# Patient Record
Sex: Female | Born: 1990 | Race: Black or African American | Hispanic: No | Marital: Single
Health system: Southern US, Community
[De-identification: ages and names within clinical notes are randomized; demographics above are authoritative.]

## PROBLEM LIST (undated history)

## (undated) DIAGNOSIS — I10 Essential (primary) hypertension: Secondary | ICD-10-CM

---
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
## 2011-02-12 ENCOUNTER — Inpatient Hospital Stay (INDEPENDENT_AMBULATORY_CARE_PROVIDER_SITE_OTHER)
Admission: RE | Admit: 2011-02-12 | Discharge: 2011-02-12 | Disposition: A | Discharge status: Home / Self Care | Payer: 59 | Source: Ambulatory Visit | Attending: Family Medicine | Admitting: Family Medicine

## 2011-02-12 DIAGNOSIS — S61209A Unspecified open wound of unspecified finger without damage to nail, initial encounter: Secondary | ICD-10-CM

## 2014-12-22 ENCOUNTER — Ambulatory Visit
Admission: RE | Admit: 2014-12-22 | Discharge: 2014-12-22 | Disposition: A | Discharge status: Home / Self Care | Payer: 59 | Source: Ambulatory Visit | Attending: Family Medicine | Admitting: Family Medicine

## 2014-12-22 ENCOUNTER — Other Ambulatory Visit: Payer: Self-pay | Admitting: Family Medicine

## 2014-12-22 DIAGNOSIS — M79644 Pain in right finger(s): Secondary | ICD-10-CM

## 2015-12-15 DIAGNOSIS — F419 Anxiety disorder, unspecified: Secondary | ICD-10-CM | POA: Diagnosis not present

## 2015-12-21 MED FILL — CITALOPRAM HBR 40 MG TABLET: 40 | 90 days supply | Qty: 90 | Fill #0

## 2016-03-20 MED FILL — CITALOPRAM HBR 40 MG TABLET: 40 | 90 days supply | Qty: 90 | Fill #1

## 2016-05-17 DIAGNOSIS — F411 Generalized anxiety disorder: Secondary | ICD-10-CM | POA: Diagnosis not present

## 2016-06-20 MED FILL — CITALOPRAM HBR 40 MG TABLET: 40 | 90 days supply | Qty: 90 | Fill #0

## 2016-07-13 IMAGING — CR DG FINGER MIDDLE 2+V*R*
1 series · 1 of 1 positions shown · non-contrast
Comparison: None.

CLINICAL DATA: Pain right third digit. No known injury. Initial
evaluation.

EXAM:
RIGHT MIDDLE FINGER 2+V

[view not recorded]
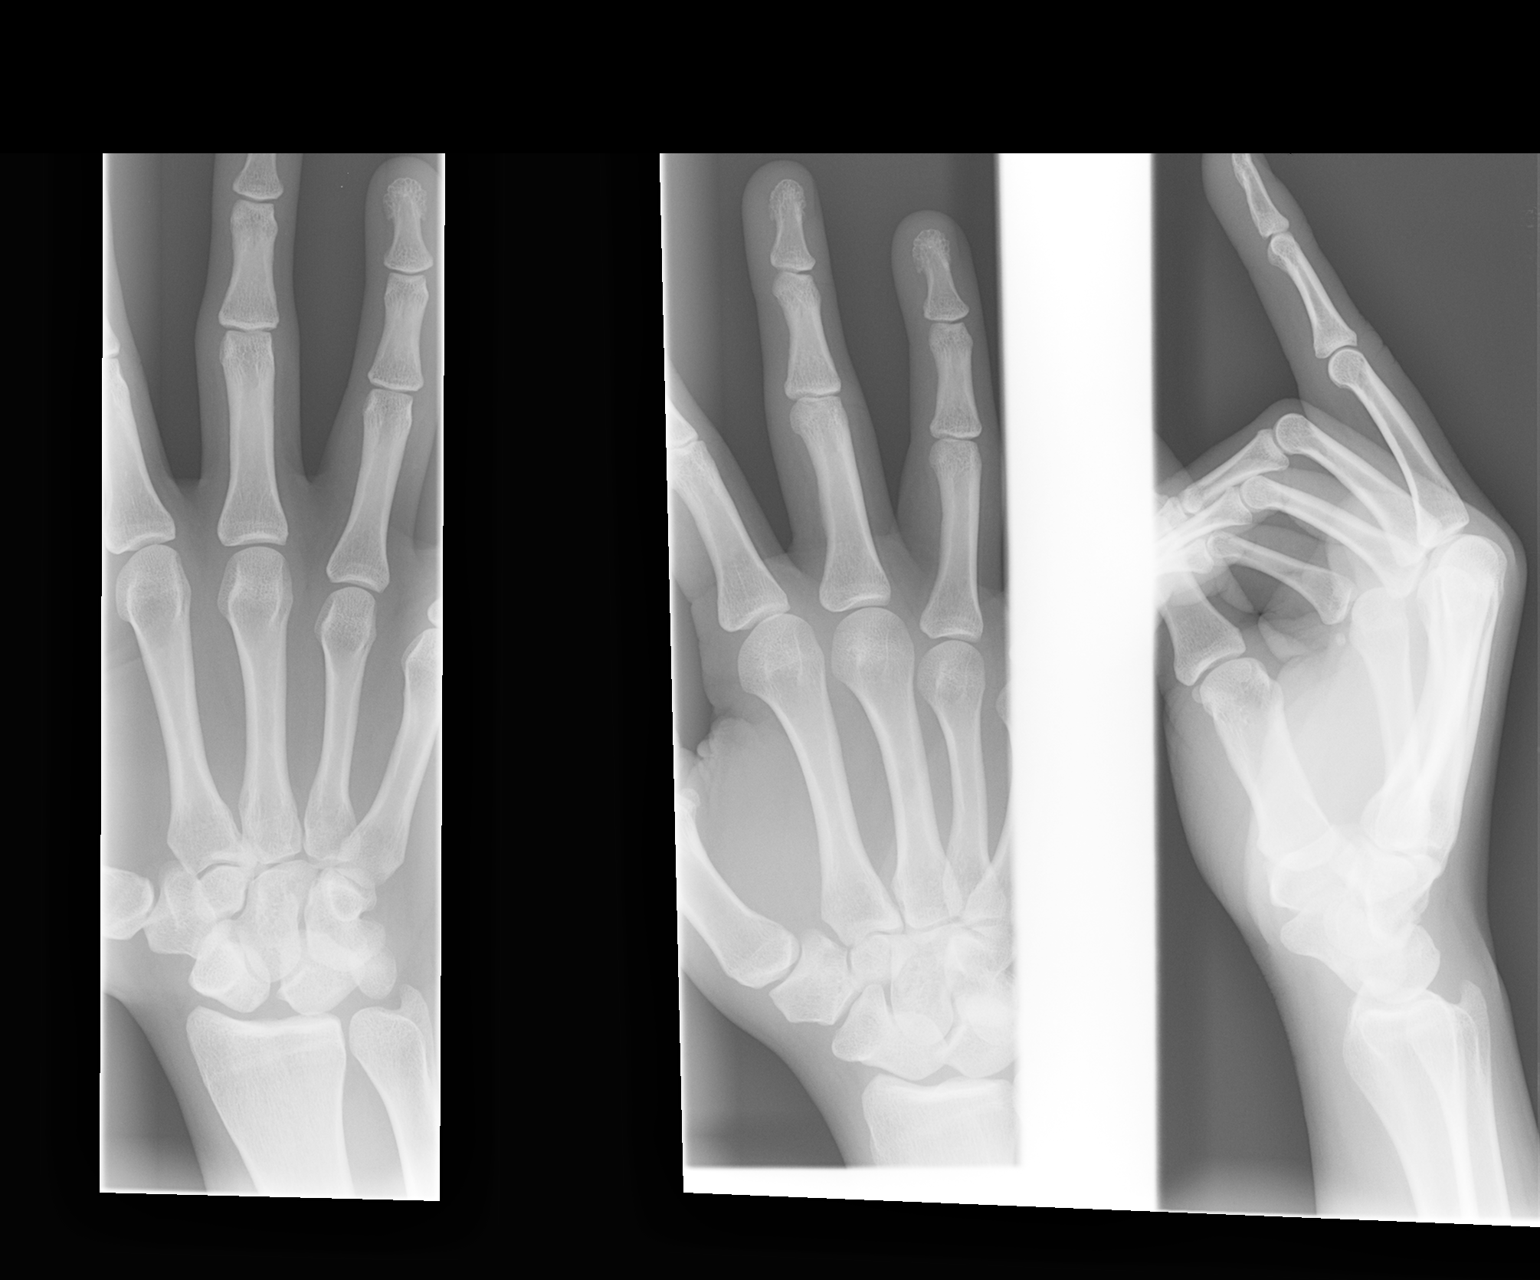

[1 of 1 positions shown; findings below may reference images not displayed]

FINDINGS: There is no evidence of fracture or dislocation. There is no
evidence of arthropathy or other focal bone abnormality. Soft
tissues are unremarkable.
IMPRESSION: No acute bony or joint abnormality identified. No evidence fracture
or dislocation.

## 2016-09-26 MED FILL — CITALOPRAM HBR 40 MG TABLET: 40 | 90 days supply | Qty: 90 | Fill #1

## 2016-11-03 DIAGNOSIS — H52223 Regular astigmatism, bilateral: Secondary | ICD-10-CM | POA: Diagnosis not present

## 2016-11-03 DIAGNOSIS — H5203 Hypermetropia, bilateral: Secondary | ICD-10-CM | POA: Diagnosis not present

## 2016-11-14 DIAGNOSIS — F411 Generalized anxiety disorder: Secondary | ICD-10-CM | POA: Diagnosis not present

## 2016-11-14 DIAGNOSIS — F324 Major depressive disorder, single episode, in partial remission: Secondary | ICD-10-CM | POA: Diagnosis not present

## 2016-11-14 DIAGNOSIS — E6609 Other obesity due to excess calories: Secondary | ICD-10-CM | POA: Diagnosis not present

## 2016-11-14 DIAGNOSIS — Z6835 Body mass index (BMI) 35.0-35.9, adult: Secondary | ICD-10-CM | POA: Diagnosis not present

## 2017-01-11 MED FILL — CITALOPRAM HBR 40 MG TABLET: 40 | 90 days supply | Qty: 90 | Fill #0

## 2017-04-20 MED FILL — CITALOPRAM HBR 40 MG TABLET: 40 | 90 days supply | Qty: 90 | Fill #1

## 2017-05-17 DIAGNOSIS — F324 Major depressive disorder, single episode, in partial remission: Secondary | ICD-10-CM | POA: Diagnosis not present

## 2017-05-17 DIAGNOSIS — R05 Cough: Secondary | ICD-10-CM | POA: Diagnosis not present

## 2017-05-17 DIAGNOSIS — F1010 Alcohol abuse, uncomplicated: Secondary | ICD-10-CM | POA: Diagnosis not present

## 2017-05-17 DIAGNOSIS — F411 Generalized anxiety disorder: Secondary | ICD-10-CM | POA: Diagnosis not present

## 2017-05-17 DIAGNOSIS — E6609 Other obesity due to excess calories: Secondary | ICD-10-CM | POA: Diagnosis not present

## 2017-05-17 DIAGNOSIS — R739 Hyperglycemia, unspecified: Secondary | ICD-10-CM | POA: Diagnosis not present

## 2017-05-17 DIAGNOSIS — Z6836 Body mass index (BMI) 36.0-36.9, adult: Secondary | ICD-10-CM | POA: Diagnosis not present

## 2017-05-17 MED FILL — raNITIdine HCL 150 MG TABS: 150 | 90 days supply | Qty: 90 | Fill #0

## 2017-07-26 MED FILL — CITALOPRAM HBR 40 MG TABLET: 40 | 90 days supply | Qty: 90 | Fill #0

## 2017-10-26 MED FILL — CITALOPRAM HBR 40 MG TABLET: 40 | 90 days supply | Qty: 90 | Fill #1

## 2017-11-07 DIAGNOSIS — E559 Vitamin D deficiency, unspecified: Secondary | ICD-10-CM | POA: Diagnosis not present

## 2017-11-07 DIAGNOSIS — Z13220 Encounter for screening for lipoid disorders: Secondary | ICD-10-CM | POA: Diagnosis not present

## 2017-11-07 DIAGNOSIS — F324 Major depressive disorder, single episode, in partial remission: Secondary | ICD-10-CM | POA: Diagnosis not present

## 2017-11-07 DIAGNOSIS — Z0000 Encounter for general adult medical examination without abnormal findings: Secondary | ICD-10-CM | POA: Diagnosis not present

## 2017-11-07 DIAGNOSIS — Z23 Encounter for immunization: Secondary | ICD-10-CM | POA: Diagnosis not present

## 2017-11-07 DIAGNOSIS — E669 Obesity, unspecified: Secondary | ICD-10-CM | POA: Diagnosis not present

## 2017-11-07 DIAGNOSIS — Z136 Encounter for screening for cardiovascular disorders: Secondary | ICD-10-CM | POA: Diagnosis not present

## 2017-11-07 DIAGNOSIS — F1010 Alcohol abuse, uncomplicated: Secondary | ICD-10-CM | POA: Diagnosis not present

## 2017-11-07 DIAGNOSIS — Z124 Encounter for screening for malignant neoplasm of cervix: Secondary | ICD-10-CM | POA: Diagnosis not present

## 2017-11-07 DIAGNOSIS — F411 Generalized anxiety disorder: Secondary | ICD-10-CM | POA: Diagnosis not present

## 2017-11-07 DIAGNOSIS — K219 Gastro-esophageal reflux disease without esophagitis: Secondary | ICD-10-CM | POA: Diagnosis not present

## 2017-11-07 DIAGNOSIS — R739 Hyperglycemia, unspecified: Secondary | ICD-10-CM | POA: Diagnosis not present

## 2017-11-20 MED FILL — VITAMIN D3 50000 UNIT CAPS: 1.25 MG | 84 days supply | Qty: 12 | Fill #0

## 2017-11-21 MED FILL — raNITIdine HCL 150 MG TABS: 150 | 90 days supply | Qty: 180 | Fill #0

## 2017-11-21 MED FILL — VALSARTAN 160 MG TABS: 160 | 30 days supply | Qty: 30 | Fill #0

## 2017-12-17 MED FILL — VALSARTAN 160 MG TABLET: 160 | 30 days supply | Qty: 30 | Fill #1

## 2017-12-26 DIAGNOSIS — I10 Essential (primary) hypertension: Secondary | ICD-10-CM | POA: Diagnosis not present

## 2017-12-26 DIAGNOSIS — E559 Vitamin D deficiency, unspecified: Secondary | ICD-10-CM | POA: Diagnosis not present

## 2018-01-18 MED FILL — VALSARTAN 160 MG TABLET: 160 | 30 days supply | Qty: 30 | Fill #2

## 2018-01-18 MED FILL — CITALOPRAM HBR 40 MG TABLET: 40 | 90 days supply | Qty: 90 | Fill #0

## 2018-01-28 DIAGNOSIS — H52223 Regular astigmatism, bilateral: Secondary | ICD-10-CM | POA: Diagnosis not present

## 2018-01-28 DIAGNOSIS — H5203 Hypermetropia, bilateral: Secondary | ICD-10-CM | POA: Diagnosis not present

## 2018-02-19 MED FILL — VALSARTAN 160 MG TABLET: 160 | 30 days supply | Qty: 30 | Fill #3

## 2018-03-11 MED FILL — raNITIdine HCL 150 MG TABS: 150 | 90 days supply | Qty: 180 | Fill #1

## 2018-03-22 MED FILL — VALSARTAN 160 MG TABLET: 160 | 30 days supply | Qty: 30 | Fill #4

## 2018-04-19 MED FILL — CITALOPRAM HBR 40 MG TABLET: 40 | 90 days supply | Qty: 90 | Fill #1

## 2018-04-19 MED FILL — VALSARTAN 160 MG TABLET: 160 | 30 days supply | Qty: 30 | Fill #5

## 2018-05-14 DIAGNOSIS — F324 Major depressive disorder, single episode, in partial remission: Secondary | ICD-10-CM | POA: Diagnosis not present

## 2018-05-14 DIAGNOSIS — E669 Obesity, unspecified: Secondary | ICD-10-CM | POA: Diagnosis not present

## 2018-05-14 DIAGNOSIS — R7303 Prediabetes: Secondary | ICD-10-CM | POA: Diagnosis not present

## 2018-05-14 DIAGNOSIS — I10 Essential (primary) hypertension: Secondary | ICD-10-CM | POA: Diagnosis not present

## 2018-05-14 DIAGNOSIS — E559 Vitamin D deficiency, unspecified: Secondary | ICD-10-CM | POA: Diagnosis not present

## 2018-05-14 DIAGNOSIS — F411 Generalized anxiety disorder: Secondary | ICD-10-CM | POA: Diagnosis not present

## 2018-05-14 DIAGNOSIS — F1010 Alcohol abuse, uncomplicated: Secondary | ICD-10-CM | POA: Diagnosis not present

## 2018-05-14 DIAGNOSIS — K219 Gastro-esophageal reflux disease without esophagitis: Secondary | ICD-10-CM | POA: Diagnosis not present

## 2018-05-24 MED FILL — FAMOTIDINE 40 MG TABS: 40 | 90 days supply | Qty: 180 | Fill #0

## 2018-05-24 MED FILL — VALSARTAN 160 MG TABLET: 160 | 90 days supply | Qty: 90 | Fill #0

## 2018-07-25 MED FILL — CITALOPRAM HBR 40 MG TABLET: 40 | 90 days supply | Qty: 90 | Fill #0

## 2018-08-08 MED FILL — FAMOTIDINE 40 MG TABLET: 40 | 30 days supply | Qty: 60 | Fill #1

## 2018-08-08 MED FILL — VALSARTAN 160 MG TABLET: 160 | 30 days supply | Qty: 30 | Fill #1

## 2018-09-09 MED FILL — VALSARTAN 80 MG TABLET: 80 | 30 days supply | Qty: 60 | Fill #0

## 2018-09-10 MED FILL — CIMETIDINE 800 MG TABLET: 800 | 90 days supply | Qty: 180 | Fill #0

## 2018-09-10 MED FILL — FAMOTIDINE 40 MG TABLET: 40 | 30 days supply | Qty: 60 | Fill #2

## 2018-10-18 MED FILL — OLMESARTAN MEDOXOMIL 20 MG: 20 | 30 days supply | Qty: 30 | Fill #0

## 2018-10-24 MED FILL — CITALOPRAM HBR 40 MG TABLET: 40 | 90 days supply | Qty: 90 | Fill #1

## 2018-11-12 DIAGNOSIS — K219 Gastro-esophageal reflux disease without esophagitis: Secondary | ICD-10-CM | POA: Diagnosis not present

## 2018-11-12 DIAGNOSIS — F324 Major depressive disorder, single episode, in partial remission: Secondary | ICD-10-CM | POA: Diagnosis not present

## 2018-11-12 DIAGNOSIS — F1010 Alcohol abuse, uncomplicated: Secondary | ICD-10-CM | POA: Diagnosis not present

## 2018-11-12 DIAGNOSIS — E669 Obesity, unspecified: Secondary | ICD-10-CM | POA: Diagnosis not present

## 2018-11-12 DIAGNOSIS — R7303 Prediabetes: Secondary | ICD-10-CM | POA: Diagnosis not present

## 2018-11-12 DIAGNOSIS — E559 Vitamin D deficiency, unspecified: Secondary | ICD-10-CM | POA: Diagnosis not present

## 2018-11-12 DIAGNOSIS — I10 Essential (primary) hypertension: Secondary | ICD-10-CM | POA: Diagnosis not present

## 2018-11-12 DIAGNOSIS — F411 Generalized anxiety disorder: Secondary | ICD-10-CM | POA: Diagnosis not present

## 2018-11-12 DIAGNOSIS — N939 Abnormal uterine and vaginal bleeding, unspecified: Secondary | ICD-10-CM | POA: Diagnosis not present

## 2018-11-12 MED FILL — OLMESARTAN MEDOXOMIL 20 MG: 20 | 30 days supply | Qty: 30 | Fill #0

## 2018-11-12 MED FILL — SM ACID REDUCER 20 MG TAB: 20 | 25 days supply | Qty: 100 | Fill #0

## 2018-12-11 DIAGNOSIS — R7303 Prediabetes: Secondary | ICD-10-CM | POA: Diagnosis not present

## 2018-12-11 DIAGNOSIS — E669 Obesity, unspecified: Secondary | ICD-10-CM | POA: Diagnosis not present

## 2018-12-11 DIAGNOSIS — I10 Essential (primary) hypertension: Secondary | ICD-10-CM | POA: Diagnosis not present

## 2018-12-11 DIAGNOSIS — N939 Abnormal uterine and vaginal bleeding, unspecified: Secondary | ICD-10-CM | POA: Diagnosis not present

## 2018-12-11 DIAGNOSIS — E559 Vitamin D deficiency, unspecified: Secondary | ICD-10-CM | POA: Diagnosis not present

## 2018-12-18 DIAGNOSIS — N939 Abnormal uterine and vaginal bleeding, unspecified: Secondary | ICD-10-CM | POA: Diagnosis not present

## 2018-12-18 DIAGNOSIS — Z01411 Encounter for gynecological examination (general) (routine) with abnormal findings: Secondary | ICD-10-CM | POA: Diagnosis not present

## 2018-12-18 DIAGNOSIS — Z113 Encounter for screening for infections with a predominantly sexual mode of transmission: Secondary | ICD-10-CM | POA: Diagnosis not present

## 2018-12-18 MED FILL — NORETHIND-ETH ESTRAD 1-0.02: 1-20 | 84 days supply | Qty: 63 | Fill #0

## 2018-12-19 MED FILL — OLMESARTAN MEDOXOMIL 20 MG: 20 | 30 days supply | Qty: 30 | Fill #1

## 2019-01-03 MED FILL — FAMOTIDINE 20 MG TABS: 20 | 30 days supply | Qty: 120 | Fill #1

## 2019-01-15 MED FILL — CHLORHEXIDINE 0.12% RINSE: 0.12 | 16 days supply | Qty: 473 | Fill #0

## 2019-01-17 MED FILL — CITALOPRAM HBR 40 MG TABLET: 40 | 90 days supply | Qty: 90 | Fill #0

## 2019-01-17 MED FILL — OLMESARTAN MEDOXOMIL 20 MG: 20 | 30 days supply | Qty: 30 | Fill #2

## 2019-02-03 MED FILL — FAMOTIDINE 20 MG TABS: 20 | 30 days supply | Qty: 120 | Fill #2

## 2019-02-18 MED FILL — FAMOTIDINE 20 MG TABS: 20 | 30 days supply | Qty: 120 | Fill #2

## 2019-02-18 MED FILL — OLMESARTAN MEDOXOMIL 20 MG: 20 | 30 days supply | Qty: 30 | Fill #3

## 2019-02-24 MED FILL — NORETHIND-ETH ESTRAD 1-0.02: 1-20 | 84 days supply | Qty: 63 | Fill #1

## 2019-03-19 MED FILL — OLMESARTAN MEDOXOMIL 20 MG: 20 | 30 days supply | Qty: 30 | Fill #4

## 2019-03-21 DIAGNOSIS — N939 Abnormal uterine and vaginal bleeding, unspecified: Secondary | ICD-10-CM | POA: Diagnosis not present

## 2019-03-28 MED FILL — FAMOTIDINE 20 MG TABS: 20 | 30 days supply | Qty: 120 | Fill #3

## 2019-04-18 MED FILL — OLMESARTAN MEDOXOMIL 20 MG: 20 | 30 days supply | Qty: 30 | Fill #5

## 2019-04-18 MED FILL — CITALOPRAM HBR 40 MG TABLET: 40 | 90 days supply | Qty: 90 | Fill #1

## 2019-04-25 MED FILL — NORETHIND-ETH ESTRAD 1-0.02: 1-20 | 84 days supply | Qty: 84 | Fill #0

## 2019-04-30 MED FILL — FAMOTIDINE 40 MG TABLET: 40 | 30 days supply | Qty: 60 | Fill #0

## 2019-05-20 MED FILL — OLMESARTAN MEDOXOMIL 20 MG: 20 | 30 days supply | Qty: 30 | Fill #1

## 2019-06-02 MED FILL — FAMOTIDINE 40 MG TABS: 40 | 30 days supply | Qty: 60 | Fill #1

## 2019-06-16 MED FILL — OLMESARTAN MEDOXOMIL 20 MG: 20 | 30 days supply | Qty: 30 | Fill #2

## 2019-07-02 MED FILL — FAMOTIDINE 40 MG TABS: 40 | 30 days supply | Qty: 60 | Fill #2

## 2019-07-16 MED FILL — OLMESARTAN MEDOXOMIL 20 MG: 20 | 30 days supply | Qty: 30 | Fill #3

## 2019-07-16 MED FILL — CITALOPRAM HBR 40 MG TABLET: 40 | 90 days supply | Qty: 90 | Fill #0

## 2019-07-16 MED FILL — NORETHIND-ETH ESTRAD 1-0.02: 1-20 | 84 days supply | Qty: 84 | Fill #1

## 2019-08-04 MED FILL — FAMOTIDINE 40 MG TABS: 40 | 30 days supply | Qty: 60 | Fill #3

## 2019-08-13 DIAGNOSIS — H5201 Hypermetropia, right eye: Secondary | ICD-10-CM | POA: Diagnosis not present

## 2019-08-13 DIAGNOSIS — H52223 Regular astigmatism, bilateral: Secondary | ICD-10-CM | POA: Diagnosis not present

## 2019-08-19 MED FILL — OLMESARTAN MEDOXOMIL 20 MG: 20 | 30 days supply | Qty: 30 | Fill #4

## 2019-09-10 MED FILL — FAMOTIDINE 40 MG TABS: 40 | 90 days supply | Qty: 180 | Fill #0

## 2019-10-07 MED FILL — NORETHIND-ETH ESTRAD 1-0.02: 1-20 | 84 days supply | Qty: 84 | Fill #2

## 2019-10-22 MED FILL — CITALOPRAM HBR 40 MG TABLET: 40 | 90 days supply | Qty: 90 | Fill #0

## 2019-11-07 ENCOUNTER — Other Ambulatory Visit (HOSPITAL_COMMUNITY): Payer: Self-pay | Admitting: Family Medicine

## 2019-11-07 DIAGNOSIS — F411 Generalized anxiety disorder: Secondary | ICD-10-CM | POA: Diagnosis not present

## 2019-11-07 DIAGNOSIS — K219 Gastro-esophageal reflux disease without esophagitis: Secondary | ICD-10-CM | POA: Diagnosis not present

## 2019-11-07 DIAGNOSIS — Z13220 Encounter for screening for lipoid disorders: Secondary | ICD-10-CM | POA: Diagnosis not present

## 2019-11-07 DIAGNOSIS — I10 Essential (primary) hypertension: Secondary | ICD-10-CM | POA: Diagnosis not present

## 2019-11-07 DIAGNOSIS — R7303 Prediabetes: Secondary | ICD-10-CM | POA: Diagnosis not present

## 2019-11-07 DIAGNOSIS — E559 Vitamin D deficiency, unspecified: Secondary | ICD-10-CM | POA: Diagnosis not present

## 2019-11-07 DIAGNOSIS — Z0000 Encounter for general adult medical examination without abnormal findings: Secondary | ICD-10-CM | POA: Diagnosis not present

## 2019-11-07 DIAGNOSIS — F1010 Alcohol abuse, uncomplicated: Secondary | ICD-10-CM | POA: Diagnosis not present

## 2019-11-07 DIAGNOSIS — F324 Major depressive disorder, single episode, in partial remission: Secondary | ICD-10-CM | POA: Diagnosis not present

## 2019-12-19 ENCOUNTER — Other Ambulatory Visit (HOSPITAL_COMMUNITY): Payer: Self-pay | Admitting: Obstetrics & Gynecology

## 2019-12-19 DIAGNOSIS — N939 Abnormal uterine and vaginal bleeding, unspecified: Secondary | ICD-10-CM | POA: Diagnosis not present

## 2019-12-19 DIAGNOSIS — Z01419 Encounter for gynecological examination (general) (routine) without abnormal findings: Secondary | ICD-10-CM | POA: Diagnosis not present

## 2019-12-19 DIAGNOSIS — Z3041 Encounter for surveillance of contraceptive pills: Secondary | ICD-10-CM | POA: Diagnosis not present

## 2019-12-31 MED FILL — NORETHIND-ETH ESTRAD 1-0.02: 1-20 | 84 days supply | Qty: 84 | Fill #3

## 2020-01-02 ENCOUNTER — Other Ambulatory Visit (HOSPITAL_COMMUNITY): Payer: Self-pay | Admitting: Family Medicine

## 2020-01-02 DIAGNOSIS — F324 Major depressive disorder, single episode, in partial remission: Secondary | ICD-10-CM | POA: Diagnosis not present

## 2020-01-02 DIAGNOSIS — F411 Generalized anxiety disorder: Secondary | ICD-10-CM | POA: Diagnosis not present

## 2020-01-02 MED FILL — ESCITALOPRAM 10 MG TABLET: 10 | 30 days supply | Qty: 60 | Fill #0

## 2020-01-02 MED FILL — ALPRAZolam 0.25 MG TABS: 0.25 | 7 days supply | Qty: 15 | Fill #0

## 2020-01-06 MED FILL — FAMOTIDINE 40 MG TABS: 40 | 90 days supply | Qty: 180 | Fill #0

## 2020-01-14 MED FILL — OLMESARTAN MEDOXOMIL 20 MG: 20 | 90 days supply | Qty: 90 | Fill #0

## 2020-01-16 ENCOUNTER — Other Ambulatory Visit (HOSPITAL_COMMUNITY): Payer: Self-pay | Admitting: Family Medicine

## 2020-01-21 DIAGNOSIS — R05 Cough: Secondary | ICD-10-CM | POA: Diagnosis not present

## 2020-01-21 MED FILL — BENZONATATE 100 MG CAPS: 100 | 10 days supply | Qty: 60 | Fill #0

## 2020-03-08 MED FILL — ESCITALOPRAM 10 MG TABLET: 10 | 30 days supply | Qty: 60 | Fill #1

## 2020-03-24 MED FILL — NORETHIND-ETH ESTRAD 1-0.02: 1-20 | 84 days supply | Qty: 84 | Fill #4

## 2020-04-13 MED FILL — OLMESARTAN MEDOXOMIL 20 MG: 20 | 90 days supply | Qty: 90 | Fill #1

## 2020-04-20 MED FILL — FAMOTIDINE 40 MG TABS: 40 | 90 days supply | Qty: 180 | Fill #1

## 2020-05-04 MED FILL — ESCITALOPRAM 10 MG TABLET: 10 | 30 days supply | Qty: 60 | Fill #2

## 2020-05-19 ENCOUNTER — Other Ambulatory Visit (HOSPITAL_COMMUNITY): Payer: Self-pay | Admitting: Family Medicine

## 2020-05-19 DIAGNOSIS — F324 Major depressive disorder, single episode, in partial remission: Secondary | ICD-10-CM | POA: Diagnosis not present

## 2020-05-19 DIAGNOSIS — R7303 Prediabetes: Secondary | ICD-10-CM | POA: Diagnosis not present

## 2020-05-19 DIAGNOSIS — I10 Essential (primary) hypertension: Secondary | ICD-10-CM | POA: Diagnosis not present

## 2020-05-19 DIAGNOSIS — E559 Vitamin D deficiency, unspecified: Secondary | ICD-10-CM | POA: Diagnosis not present

## 2020-05-19 DIAGNOSIS — F419 Anxiety disorder, unspecified: Secondary | ICD-10-CM | POA: Diagnosis not present

## 2020-05-19 DIAGNOSIS — K219 Gastro-esophageal reflux disease without esophagitis: Secondary | ICD-10-CM | POA: Diagnosis not present

## 2020-05-19 MED FILL — ESCITALOPRAM 20 MG TABLET: 20 | 30 days supply | Qty: 30 | Fill #0

## 2020-05-19 MED FILL — ALPRAZolam 0.25 MG TABS: 0.25 | 30 days supply | Qty: 15 | Fill #0

## 2020-06-14 MED FILL — ESCITALOPRAM 20 MG TABLET: 20 | 30 days supply | Qty: 30 | Fill #1

## 2020-06-16 ENCOUNTER — Other Ambulatory Visit (HOSPITAL_COMMUNITY): Payer: Self-pay | Admitting: Family Medicine

## 2020-06-16 DIAGNOSIS — F324 Major depressive disorder, single episode, in partial remission: Secondary | ICD-10-CM | POA: Diagnosis not present

## 2020-06-16 DIAGNOSIS — F411 Generalized anxiety disorder: Secondary | ICD-10-CM | POA: Diagnosis not present

## 2020-06-16 DIAGNOSIS — B354 Tinea corporis: Secondary | ICD-10-CM | POA: Diagnosis not present

## 2020-06-16 MED FILL — NORETHIND-ETH ESTRAD 1-0.02: 1-20 | 84 days supply | Qty: 84 | Fill #0

## 2020-06-16 MED FILL — FLUCONAZOLE 150 MG TABS: 150 | 28 days supply | Qty: 4 | Fill #0

## 2020-06-22 DIAGNOSIS — R7303 Prediabetes: Secondary | ICD-10-CM | POA: Diagnosis not present

## 2020-06-22 DIAGNOSIS — Z13220 Encounter for screening for lipoid disorders: Secondary | ICD-10-CM | POA: Diagnosis not present

## 2020-06-22 DIAGNOSIS — I10 Essential (primary) hypertension: Secondary | ICD-10-CM | POA: Diagnosis not present

## 2020-06-25 MED FILL — OLMESARTAN MEDOXOMIL 20 MG: 20 | 90 days supply | Qty: 90 | Fill #0

## 2020-07-23 ENCOUNTER — Other Ambulatory Visit (HOSPITAL_COMMUNITY): Payer: Self-pay | Admitting: Family Medicine

## 2020-07-30 ENCOUNTER — Other Ambulatory Visit (HOSPITAL_BASED_OUTPATIENT_CLINIC_OR_DEPARTMENT_OTHER): Payer: Self-pay

## 2020-08-17 ENCOUNTER — Other Ambulatory Visit (HOSPITAL_COMMUNITY): Payer: Self-pay

## 2020-08-17 MED ORDER — ESCITALOPRAM OXALATE 20 MG PO TABS
ORAL_TABLET | ORAL | 0 refills | Status: DC
  Filled 2020-08-17: qty 90, 90d supply, fill #0

## 2020-08-19 ENCOUNTER — Other Ambulatory Visit (HOSPITAL_COMMUNITY): Payer: Self-pay

## 2020-09-07 ENCOUNTER — Other Ambulatory Visit (HOSPITAL_COMMUNITY): Payer: Self-pay

## 2020-09-07 MED FILL — Norethindrone Ace & Ethinyl Estradiol Tab 1 MG-20 MCG: ORAL | 84 days supply | Qty: 84 | Fill #0 | Status: AC

## 2020-09-14 DIAGNOSIS — H5203 Hypermetropia, bilateral: Secondary | ICD-10-CM | POA: Diagnosis not present

## 2020-09-14 DIAGNOSIS — H52223 Regular astigmatism, bilateral: Secondary | ICD-10-CM | POA: Diagnosis not present

## 2020-10-12 ENCOUNTER — Other Ambulatory Visit (HOSPITAL_COMMUNITY): Payer: Self-pay

## 2020-10-12 MED FILL — Olmesartan Medoxomil Tab 20 MG: ORAL | 90 days supply | Qty: 90 | Fill #0 | Status: AC

## 2020-10-22 DIAGNOSIS — G43909 Migraine, unspecified, not intractable, without status migrainosus: Secondary | ICD-10-CM | POA: Diagnosis not present

## 2020-10-25 ENCOUNTER — Other Ambulatory Visit (HOSPITAL_COMMUNITY): Payer: Self-pay

## 2020-10-25 MED FILL — Famotidine Tab 40 MG: ORAL | 90 days supply | Qty: 180 | Fill #0 | Status: AC

## 2020-11-15 ENCOUNTER — Other Ambulatory Visit (HOSPITAL_COMMUNITY): Payer: Self-pay

## 2020-11-15 MED ORDER — ESCITALOPRAM OXALATE 20 MG PO TABS
20.0000 mg | ORAL_TABLET | Freq: Every day | ORAL | 0 refills | Status: DC
  Filled 2020-11-15: qty 90, 90d supply, fill #0

## 2020-11-16 ENCOUNTER — Other Ambulatory Visit (HOSPITAL_COMMUNITY): Payer: Self-pay

## 2020-11-29 ENCOUNTER — Other Ambulatory Visit (HOSPITAL_COMMUNITY): Payer: Self-pay

## 2020-11-29 MED FILL — Norethindrone Ace & Ethinyl Estradiol Tab 1 MG-20 MCG: ORAL | 84 days supply | Qty: 84 | Fill #1 | Status: AC

## 2020-11-30 ENCOUNTER — Ambulatory Visit: Payer: 59

## 2020-12-14 ENCOUNTER — Other Ambulatory Visit (HOSPITAL_COMMUNITY): Payer: Self-pay

## 2020-12-14 MED ORDER — ALPRAZOLAM 0.25 MG PO TABS
ORAL_TABLET | ORAL | 0 refills | Status: DC
  Filled 2020-12-14: qty 30, 30d supply, fill #0

## 2020-12-24 ENCOUNTER — Other Ambulatory Visit (HOSPITAL_COMMUNITY): Payer: Self-pay

## 2020-12-24 DIAGNOSIS — M545 Low back pain, unspecified: Secondary | ICD-10-CM | POA: Diagnosis not present

## 2020-12-24 MED ORDER — CARESTART COVID-19 HOME TEST VI KIT
PACK | 0 refills | Status: AC
  Filled 2020-12-24: qty 4, 4d supply, fill #0

## 2020-12-24 MED ORDER — TRAMADOL HCL 50 MG PO TABS
50.0000 mg | ORAL_TABLET | Freq: Two times a day (BID) | ORAL | 0 refills | Status: AC | PRN
  Filled 2020-12-24: qty 6, 3d supply, fill #0

## 2020-12-24 MED ORDER — CYCLOBENZAPRINE HCL 10 MG PO TABS
10.0000 mg | ORAL_TABLET | Freq: Three times a day (TID) | ORAL | 0 refills | Status: AC | PRN
  Filled 2020-12-24: qty 30, 10d supply, fill #0

## 2020-12-24 MED ORDER — MELOXICAM 15 MG PO TABS
15.0000 mg | ORAL_TABLET | Freq: Every day | ORAL | 0 refills | Status: DC
  Filled 2020-12-24: qty 14, 14d supply, fill #0

## 2021-01-11 ENCOUNTER — Other Ambulatory Visit (HOSPITAL_COMMUNITY): Payer: Self-pay

## 2021-01-11 MED ORDER — OLMESARTAN MEDOXOMIL 20 MG PO TABS
ORAL_TABLET | ORAL | 1 refills | Status: DC
  Filled 2021-01-11: qty 90, 90d supply, fill #0
  Filled 2021-04-11: qty 90, 90d supply, fill #1

## 2021-01-14 ENCOUNTER — Other Ambulatory Visit: Payer: Self-pay

## 2021-01-14 ENCOUNTER — Other Ambulatory Visit (HOSPITAL_BASED_OUTPATIENT_CLINIC_OR_DEPARTMENT_OTHER): Payer: Self-pay | Admitting: Family Medicine

## 2021-01-14 ENCOUNTER — Ambulatory Visit (HOSPITAL_BASED_OUTPATIENT_CLINIC_OR_DEPARTMENT_OTHER)
Admission: RE | Admit: 2021-01-14 | Discharge: 2021-01-14 | Disposition: A | Discharge status: Home / Self Care | Payer: 59 | Source: Ambulatory Visit | Attending: Family Medicine | Admitting: Family Medicine

## 2021-01-14 ENCOUNTER — Other Ambulatory Visit (HOSPITAL_COMMUNITY): Payer: Self-pay

## 2021-01-14 DIAGNOSIS — M79605 Pain in left leg: Secondary | ICD-10-CM | POA: Insufficient documentation

## 2021-01-14 MED ORDER — MELOXICAM 15 MG PO TABS
ORAL_TABLET | ORAL | 0 refills | Status: AC
  Filled 2021-01-14: qty 14, 14d supply, fill #0

## 2021-01-14 MED ORDER — MELOXICAM 15 MG PO TABS
15.0000 mg | ORAL_TABLET | Freq: Every day | ORAL | 0 refills | Status: DC
  Filled 2021-01-14: qty 14, 14d supply, fill #0

## 2021-01-24 ENCOUNTER — Other Ambulatory Visit (HOSPITAL_COMMUNITY): Payer: Self-pay

## 2021-01-24 DIAGNOSIS — Z01419 Encounter for gynecological examination (general) (routine) without abnormal findings: Secondary | ICD-10-CM | POA: Diagnosis not present

## 2021-01-24 DIAGNOSIS — Z803 Family history of malignant neoplasm of breast: Secondary | ICD-10-CM | POA: Diagnosis not present

## 2021-01-24 DIAGNOSIS — N939 Abnormal uterine and vaginal bleeding, unspecified: Secondary | ICD-10-CM | POA: Diagnosis not present

## 2021-01-24 MED ORDER — NORETHINDRONE ACET-ETHINYL EST 1-20 MG-MCG PO TABS
ORAL_TABLET | ORAL | 4 refills | Status: AC
  Filled 2021-01-24: qty 84, 84d supply, fill #0
  Filled 2021-05-16: qty 84, 84d supply, fill #1
  Filled 2021-08-07: qty 84, 84d supply, fill #2
  Filled 2021-10-26: qty 84, 84d supply, fill #3

## 2021-01-26 ENCOUNTER — Other Ambulatory Visit (HOSPITAL_COMMUNITY): Payer: Self-pay

## 2021-01-26 MED ORDER — FAMOTIDINE 40 MG PO TABS
ORAL_TABLET | ORAL | 1 refills | Status: DC
  Filled 2021-01-26: qty 180, 90d supply, fill #0
  Filled 2021-05-02: qty 180, 90d supply, fill #1

## 2021-01-27 ENCOUNTER — Other Ambulatory Visit (HOSPITAL_COMMUNITY): Payer: Self-pay

## 2021-02-07 ENCOUNTER — Other Ambulatory Visit (HOSPITAL_COMMUNITY): Payer: Self-pay

## 2021-02-14 ENCOUNTER — Other Ambulatory Visit (HOSPITAL_COMMUNITY): Payer: Self-pay

## 2021-02-15 ENCOUNTER — Other Ambulatory Visit (HOSPITAL_COMMUNITY): Payer: Self-pay

## 2021-02-15 MED ORDER — ALPRAZOLAM 0.25 MG PO TABS
0.2500 mg | ORAL_TABLET | Freq: Two times a day (BID) | ORAL | 0 refills | Status: DC
  Filled 2021-02-15: qty 30, 15d supply, fill #0

## 2021-02-15 MED ORDER — ESCITALOPRAM OXALATE 20 MG PO TABS
ORAL_TABLET | ORAL | 0 refills | Status: DC
  Filled 2021-02-15: qty 90, 90d supply, fill #0

## 2021-03-04 ENCOUNTER — Other Ambulatory Visit (HOSPITAL_COMMUNITY): Payer: Self-pay

## 2021-03-04 DIAGNOSIS — M25562 Pain in left knee: Secondary | ICD-10-CM | POA: Diagnosis not present

## 2021-03-04 MED ORDER — MELOXICAM 15 MG PO TABS
ORAL_TABLET | ORAL | 0 refills | Status: AC
  Filled 2021-03-04: qty 14, 14d supply, fill #0

## 2021-03-09 ENCOUNTER — Ambulatory Visit: Payer: Self-pay

## 2021-03-09 ENCOUNTER — Encounter: Payer: Self-pay | Admitting: Orthopaedic Surgery

## 2021-03-09 ENCOUNTER — Ambulatory Visit: Payer: 59 | Admitting: Orthopaedic Surgery

## 2021-03-09 ENCOUNTER — Other Ambulatory Visit: Payer: Self-pay

## 2021-03-09 DIAGNOSIS — M25562 Pain in left knee: Secondary | ICD-10-CM | POA: Diagnosis not present

## 2021-03-09 DIAGNOSIS — G8929 Other chronic pain: Secondary | ICD-10-CM | POA: Diagnosis not present

## 2021-03-09 MED ORDER — BUPIVACAINE HCL 0.5 % IJ SOLN
2.0000 mL | INTRAMUSCULAR | Status: AC | PRN
  Administered 2021-03-09: 2 mL via INTRA_ARTICULAR

## 2021-03-09 MED ORDER — LIDOCAINE HCL 1 % IJ SOLN
2.0000 mL | INTRAMUSCULAR | Status: AC | PRN
  Administered 2021-03-09: 2 mL

## 2021-03-09 MED ORDER — METHYLPREDNISOLONE ACETATE 40 MG/ML IJ SUSP
40.0000 mg | INTRAMUSCULAR | Status: AC | PRN
  Administered 2021-03-09: 40 mg via INTRA_ARTICULAR

## 2021-03-09 NOTE — Progress Notes (Signed)
Office Visit Note   Patient: Vanessa Osborn           Date of Birth: 1990/08/25           MRN: [id number] Visit Date: 03/09/2021              Requested by: Wilfrid Lund, PA [address] [place],  [place] [address] PCP: Wilfrid Lund, [place]   Assessment & Plan: Visit Diagnoses:  1. 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  X-rays are relatively unremarkable.  Based on findings and temporary relief from meloxicam I recommended cortisone injection followed by 6 weeks of relative rest.  She should follow-up if she does not feel any improvement at which point we would likely obtain MRI.  Follow-Up Instructions: No follow-ups on file.   Orders:  Orders Placed This Encounter  Procedures   XR KNEE 3 VIEW LEFT   No orders of the defined types were placed in this encounter.     Procedures: Large Joint Inj: L knee on 03/09/2021 8:27 AM Details: 22 G needle Medications: 2 mL bupivacaine 0.5 %; 2 mL lidocaine 1 %; 40 mg methylPREDNISolone acetate 40 MG/ML Outcome: tolerated well, no immediate complications Patient was prepped and draped in the usual sterile fashion.      Clinical Data: No additional findings.   Subjective: Chief Complaint  Patient presents with   Left Knee - Pain    Vanessa Osborn is a 30 year old healthy individual who comes in for chronic left knee pain for about 2 months.  She feels that this started shortly after she went on vacation for 10 days in which she did a lot of walking.  Denies any particular injuries or trauma.  She does feel some swelling which did improve after she was prescribed meloxicam by PCP however relief was short-lived.  She started meloxicam again yesterday which has helped but she would like more permanent relief.  Denies any mechanical symptoms.   Review of Systems  Constitutional: Negative.   HENT: Negative.    Eyes: Negative.   Respiratory:  Negative.    Cardiovascular: Negative.   Endocrine: Negative.   Musculoskeletal: Negative.   Neurological: Negative.   Hematological: Negative.   Psychiatric/Behavioral: Negative.    All other systems reviewed and are negative.   Objective: Vital Signs: There were no vitals taken for this visit.  Physical Exam Vitals and nursing note reviewed.  Constitutional:      Appearance: Esperanza Heir "Vanessa Osborn" is well-developed.  Pulmonary:     Effort: Pulmonary effort is normal.  Skin:    General: Skin is warm.     Capillary Refill: Capillary refill takes less than 2 seconds.  Neurological:     Mental Status: Esperanza Heir "[name]" is alert and oriented to person, place, and time.  Psychiatric:        Behavior: Behavior normal.        Thought Content: Thought content normal.        Judgment: Judgment normal.    Ortho Exam  Left knee shows a small effusion.  Slight medial joint line tenderness.  Negative McMurray.  Range of motion is well-preserved.  Collaterals and cruciates are stable.  Negative patellofemoral crepitus.  Specialty Comments:  No specialty comments available.  Imaging: XR KNEE 3 VIEW LEFT  Result Date: 03/09/2021 No acute or structural abnormalities    PMFS History: There are no  problems to display for this patient.  History reviewed. No pertinent past medical history.  History reviewed. No pertinent family history.  History reviewed. No pertinent surgical history. Social History   Occupational History   Not on file  Tobacco Use   Smoking status: Not on file   Smokeless tobacco: Not on file  Substance and Sexual Activity   Alcohol use: Not on file   Drug use: Not on file   Sexual activity: Not on file

## 2021-04-11 ENCOUNTER — Other Ambulatory Visit (HOSPITAL_COMMUNITY): Payer: Self-pay

## 2021-04-21 ENCOUNTER — Other Ambulatory Visit (HOSPITAL_COMMUNITY): Payer: Self-pay

## 2021-04-22 ENCOUNTER — Other Ambulatory Visit (HOSPITAL_COMMUNITY): Payer: Self-pay

## 2021-04-22 MED ORDER — ALPRAZOLAM 0.25 MG PO TABS
ORAL_TABLET | ORAL | 0 refills | Status: DC
  Filled 2021-04-22: qty 30, 15d supply, fill #0

## 2021-05-03 ENCOUNTER — Other Ambulatory Visit (HOSPITAL_COMMUNITY): Payer: Self-pay

## 2021-05-16 ENCOUNTER — Other Ambulatory Visit (HOSPITAL_COMMUNITY): Payer: Self-pay

## 2021-05-17 ENCOUNTER — Other Ambulatory Visit (HOSPITAL_COMMUNITY): Payer: Self-pay

## 2021-05-17 MED ORDER — ESCITALOPRAM OXALATE 20 MG PO TABS
20.0000 mg | ORAL_TABLET | Freq: Every day | ORAL | 0 refills | Status: DC
  Filled 2021-05-17: qty 90, 90d supply, fill #0

## 2021-05-24 ENCOUNTER — Other Ambulatory Visit (HOSPITAL_COMMUNITY): Payer: Self-pay

## 2021-05-24 MED ORDER — ALPRAZOLAM 0.25 MG PO TABS
ORAL_TABLET | ORAL | 0 refills | Status: DC
  Filled 2021-05-24: qty 30, 30d supply, fill #0

## 2021-07-10 ENCOUNTER — Other Ambulatory Visit (HOSPITAL_COMMUNITY): Payer: Self-pay

## 2021-07-11 ENCOUNTER — Other Ambulatory Visit (HOSPITAL_COMMUNITY): Payer: Self-pay

## 2021-07-11 MED ORDER — OLMESARTAN MEDOXOMIL 20 MG PO TABS
20.0000 mg | ORAL_TABLET | Freq: Every day | ORAL | 1 refills | Status: DC
  Filled 2021-07-11: qty 90, 90d supply, fill #0
  Filled 2021-10-11: qty 90, 90d supply, fill #1

## 2021-08-03 ENCOUNTER — Other Ambulatory Visit (HOSPITAL_COMMUNITY): Payer: Self-pay

## 2021-08-04 ENCOUNTER — Other Ambulatory Visit (HOSPITAL_COMMUNITY): Payer: Self-pay

## 2021-08-04 MED ORDER — FAMOTIDINE 40 MG PO TABS
ORAL_TABLET | ORAL | 1 refills | Status: DC
  Filled 2021-08-04: qty 180, 90d supply, fill #0
  Filled 2021-10-09: qty 180, 90d supply, fill #1

## 2021-08-07 ENCOUNTER — Other Ambulatory Visit (HOSPITAL_COMMUNITY): Payer: Self-pay

## 2021-08-08 ENCOUNTER — Other Ambulatory Visit (HOSPITAL_COMMUNITY): Payer: Self-pay

## 2021-08-08 MED ORDER — ESCITALOPRAM OXALATE 20 MG PO TABS
ORAL_TABLET | ORAL | 0 refills | Status: DC
  Filled 2021-08-08: qty 30, 30d supply, fill #0

## 2021-08-17 ENCOUNTER — Other Ambulatory Visit (HOSPITAL_COMMUNITY): Payer: Self-pay

## 2021-08-18 ENCOUNTER — Other Ambulatory Visit (HOSPITAL_COMMUNITY): Payer: Self-pay

## 2021-08-18 MED ORDER — ALPRAZOLAM 0.25 MG PO TABS
ORAL_TABLET | ORAL | 0 refills | Status: AC
  Filled 2021-08-18: qty 30, 30d supply, fill #0

## 2021-09-04 ENCOUNTER — Other Ambulatory Visit (HOSPITAL_COMMUNITY): Payer: Self-pay

## 2021-09-05 ENCOUNTER — Other Ambulatory Visit (HOSPITAL_COMMUNITY): Payer: Self-pay

## 2021-09-05 MED ORDER — ESCITALOPRAM OXALATE 20 MG PO TABS
20.0000 mg | ORAL_TABLET | Freq: Every day | ORAL | 0 refills | Status: DC
  Filled 2021-09-05: qty 30, 30d supply, fill #0

## 2021-10-09 ENCOUNTER — Other Ambulatory Visit (HOSPITAL_COMMUNITY): Payer: Self-pay

## 2021-10-10 ENCOUNTER — Other Ambulatory Visit (HOSPITAL_COMMUNITY): Payer: Self-pay

## 2021-10-10 MED ORDER — ESCITALOPRAM OXALATE 20 MG PO TABS
ORAL_TABLET | ORAL | 0 refills | Status: DC
  Filled 2021-10-10: qty 30, 30d supply, fill #0

## 2021-10-11 ENCOUNTER — Other Ambulatory Visit (HOSPITAL_COMMUNITY): Payer: Self-pay

## 2021-10-11 DIAGNOSIS — H5203 Hypermetropia, bilateral: Secondary | ICD-10-CM | POA: Diagnosis not present

## 2021-10-15 ENCOUNTER — Other Ambulatory Visit (HOSPITAL_COMMUNITY): Payer: Self-pay

## 2021-10-26 ENCOUNTER — Other Ambulatory Visit (HOSPITAL_COMMUNITY): Payer: Self-pay

## 2021-11-01 ENCOUNTER — Other Ambulatory Visit (HOSPITAL_COMMUNITY): Payer: Self-pay

## 2021-11-01 DIAGNOSIS — R7303 Prediabetes: Secondary | ICD-10-CM | POA: Diagnosis not present

## 2021-11-01 DIAGNOSIS — F324 Major depressive disorder, single episode, in partial remission: Secondary | ICD-10-CM | POA: Diagnosis not present

## 2021-11-01 DIAGNOSIS — Z13220 Encounter for screening for lipoid disorders: Secondary | ICD-10-CM | POA: Diagnosis not present

## 2021-11-01 DIAGNOSIS — G43909 Migraine, unspecified, not intractable, without status migrainosus: Secondary | ICD-10-CM | POA: Diagnosis not present

## 2021-11-01 DIAGNOSIS — K219 Gastro-esophageal reflux disease without esophagitis: Secondary | ICD-10-CM | POA: Diagnosis not present

## 2021-11-01 DIAGNOSIS — F411 Generalized anxiety disorder: Secondary | ICD-10-CM | POA: Diagnosis not present

## 2021-11-01 DIAGNOSIS — I10 Essential (primary) hypertension: Secondary | ICD-10-CM | POA: Diagnosis not present

## 2021-11-01 DIAGNOSIS — Z0000 Encounter for general adult medical examination without abnormal findings: Secondary | ICD-10-CM | POA: Diagnosis not present

## 2021-11-01 DIAGNOSIS — M79671 Pain in right foot: Secondary | ICD-10-CM | POA: Diagnosis not present

## 2021-11-01 MED ORDER — OLMESARTAN MEDOXOMIL 20 MG PO TABS
ORAL_TABLET | ORAL | 1 refills | Status: DC
  Filled 2022-01-08: qty 90, 90d supply, fill #0
  Filled 2022-04-04: qty 90, 90d supply, fill #1

## 2021-11-01 MED ORDER — FAMOTIDINE 40 MG PO TABS
ORAL_TABLET | ORAL | 1 refills | Status: AC
  Filled 2022-01-30: qty 180, 90d supply, fill #0
  Filled 2022-08-06: qty 180, 90d supply, fill #1

## 2021-11-01 MED ORDER — ESCITALOPRAM OXALATE 20 MG PO TABS
ORAL_TABLET | ORAL | 1 refills | Status: DC
  Filled 2021-11-01: qty 90, 90d supply, fill #0
  Filled 2022-02-08: qty 90, 90d supply, fill #1

## 2021-11-03 ENCOUNTER — Other Ambulatory Visit (HOSPITAL_COMMUNITY): Payer: Self-pay

## 2021-11-22 DIAGNOSIS — D72829 Elevated white blood cell count, unspecified: Secondary | ICD-10-CM | POA: Diagnosis not present

## 2021-11-29 ENCOUNTER — Other Ambulatory Visit (HOSPITAL_COMMUNITY): Payer: Self-pay

## 2021-11-30 ENCOUNTER — Other Ambulatory Visit (HOSPITAL_COMMUNITY): Payer: Self-pay

## 2021-11-30 MED ORDER — ALPRAZOLAM 0.25 MG PO TABS
ORAL_TABLET | ORAL | 0 refills | Status: AC
  Filled 2021-11-30: qty 30, 30d supply, fill #0

## 2022-01-09 ENCOUNTER — Other Ambulatory Visit (HOSPITAL_COMMUNITY): Payer: Self-pay

## 2022-01-10 ENCOUNTER — Other Ambulatory Visit (HOSPITAL_COMMUNITY): Payer: Self-pay

## 2022-01-26 ENCOUNTER — Other Ambulatory Visit (HOSPITAL_COMMUNITY): Payer: Self-pay

## 2022-01-26 DIAGNOSIS — Z01419 Encounter for gynecological examination (general) (routine) without abnormal findings: Secondary | ICD-10-CM | POA: Diagnosis not present

## 2022-01-26 DIAGNOSIS — Z3041 Encounter for surveillance of contraceptive pills: Secondary | ICD-10-CM | POA: Diagnosis not present

## 2022-01-26 DIAGNOSIS — Z124 Encounter for screening for malignant neoplasm of cervix: Secondary | ICD-10-CM | POA: Diagnosis not present

## 2022-01-26 MED ORDER — NORETHINDRONE ACET-ETHINYL EST 1-20 MG-MCG PO TABS
1.0000 | ORAL_TABLET | Freq: Every day | ORAL | 4 refills | Status: DC
  Filled 2022-01-26: qty 84, 84d supply, fill #0
  Filled 2022-04-04: qty 84, 84d supply, fill #1
  Filled 2022-07-07: qty 84, 84d supply, fill #2
  Filled 2022-10-01: qty 84, 84d supply, fill #3
  Filled 2022-12-24: qty 84, 84d supply, fill #4

## 2022-01-30 ENCOUNTER — Other Ambulatory Visit (HOSPITAL_COMMUNITY): Payer: Self-pay

## 2022-01-31 ENCOUNTER — Other Ambulatory Visit (HOSPITAL_COMMUNITY): Payer: Self-pay

## 2022-02-03 ENCOUNTER — Other Ambulatory Visit (HOSPITAL_COMMUNITY): Payer: Self-pay

## 2022-02-03 ENCOUNTER — Telehealth: Payer: 59 | Admitting: Physician Assistant

## 2022-02-03 DIAGNOSIS — U071 COVID-19: Secondary | ICD-10-CM | POA: Diagnosis not present

## 2022-02-03 MED ORDER — FLUTICASONE PROPIONATE 50 MCG/ACT NA SUSP
2.0000 | Freq: Every day | NASAL | 0 refills | Status: AC
  Filled 2022-02-03: qty 16, 30d supply, fill #0

## 2022-02-03 MED ORDER — MOLNUPIRAVIR EUA 200MG CAPSULE
4.0000 | ORAL_CAPSULE | Freq: Two times a day (BID) | ORAL | 0 refills | Status: AC
  Filled 2022-02-03: qty 40, 5d supply, fill #0

## 2022-02-03 MED ORDER — BENZONATATE 100 MG PO CAPS
100.0000 mg | ORAL_CAPSULE | Freq: Three times a day (TID) | ORAL | 0 refills | Status: AC | PRN
  Filled 2022-02-03: qty 30, 10d supply, fill #0

## 2022-02-03 MED ORDER — PROMETHAZINE-DM 6.25-15 MG/5ML PO SYRP
5.0000 mL | ORAL_SOLUTION | Freq: Four times a day (QID) | ORAL | 0 refills | Status: AC | PRN
  Filled 2022-02-03: qty 118, 6d supply, fill #0

## 2022-02-03 NOTE — Patient Instructions (Signed)
Ninfa Meeker, thank you for joining Mar Daring, PA-C for today's virtual visit.  While this provider is not your primary care provider (PCP), if your PCP is located in our provider database this encounter information will be shared with them immediately following your visit.  Consent: (Patient) Ninfa Meeker provided verbal consent for this virtual visit at the beginning of the encounter.  Current Medications:  Current Outpatient Medications:    benzonatate (TESSALON) 100 MG capsule, Take 1 capsule (100 mg total) by mouth 3 (three) times daily as needed., Disp: 30 capsule, Rfl: 0   fluticasone (FLONASE) 50 MCG/ACT nasal spray, Place 2 sprays into both nostrils daily., Disp: 16 g, Rfl: 0   molnupiravir EUA (LAGEVRIO) 200 mg CAPS capsule, Take 4 capsules (800 mg total) by mouth 2 (two) times daily for 5 days., Disp: 40 capsule, Rfl: 0   promethazine-dextromethorphan (PROMETHAZINE-DM) 6.25-15 MG/5ML syrup, Take 5 mLs by mouth 4 (four) times daily as needed., Disp: 118 mL, Rfl: 0   ALPRAZolam (XANAX) 0.25 MG tablet, Take 1 tablet by mouth twice a day 30 days, Disp: 30 tablet, Rfl: 0   ALPRAZolam (XANAX) 0.25 MG tablet, Take 1 tablet by mouth twice daily, Disp: 30 tablet, Rfl: 0   COVID-19 At Home Antigen Test (CARESTART COVID-19 HOME TEST) KIT, Use as directed, Disp: 4 each, Rfl: 0   cyclobenzaprine (FLEXERIL) 10 MG tablet, Take 1 tablet (10 mg total) by mouth every 8 (eight) hours as needed., Disp: 30 tablet, Rfl: 0   escitalopram (LEXAPRO) 10 MG tablet, TAKE 2 TABLETS BY MOUTH ONCE A DAY, Disp: 60 tablet, Rfl: 2   escitalopram (LEXAPRO) 20 MG tablet, TAKE 1 TABLET BY MOUTH ONCE A DAY, Disp: 30 tablet, Rfl: 2   escitalopram (LEXAPRO) 20 MG tablet, Take 1 tablet by mouth once a day, Disp: 90 tablet, Rfl: 1   famotidine (PEPCID) 40 MG tablet, TAKE 1 TABLET BY MOUTH TWICE DAILY, Disp: 180 tablet, Rfl: 1   famotidine (PEPCID) 40 MG tablet, TAKE 1 TABLET BY MOUTH 2 TIMES DAILY, Disp:  180 tablet, Rfl: 1   famotidine (PEPCID) 40 MG tablet, TAKE 1 TABLET BY MOUTH TWO TIMES DAILY, Disp: 180 tablet, Rfl: 1   famotidine (PEPCID) 40 MG tablet, Take 1 tablet by mouth twice a day, Disp: 180 tablet, Rfl: 1   meloxicam (MOBIC) 15 MG tablet, Take 1 tablet by mouth once a day, Disp: 14 tablet, Rfl: 0   meloxicam (MOBIC) 15 MG tablet, Take 1 tablet by mouth once a day for 14 days, Disp: 14 tablet, Rfl: 0   norethindrone-ethinyl estradiol (JUNEL 1/20) 1-20 MG-MCG tablet, Take 1 tablet by mouth once a day continuosly, Disp: 84 tablet, Rfl: 4   norethindrone-ethinyl estradiol (LOESTRIN) 1-20 MG-MCG tablet, Take 1 tablet by mouth daily., Disp: 90 tablet, Rfl: 4   olmesartan (BENICAR) 20 MG tablet, TAKE 1 TABLET BY MOUTH ONCE A DAY, Disp: 90 tablet, Rfl: 1   olmesartan (BENICAR) 20 MG tablet, TAKE 1 TABLET BY MOUTH ONCE DAILY, Disp: 90 tablet, Rfl: 1   olmesartan (BENICAR) 20 MG tablet, TAKE 1 TABLET BY MOUTH ONCE A DAY, Disp: 90 tablet, Rfl: 1   olmesartan (BENICAR) 20 MG tablet, Take 1 tablet by mouth once a day, Disp: 90 tablet, Rfl: 1   traMADol (ULTRAM) 50 MG tablet, Take 1 tablet (50 mg total) by mouth 2 (two) times daily as needed for pain, Disp: 6 tablet, Rfl: 0   Medications ordered in this encounter:  Meds  ordered this encounter  Medications   molnupiravir EUA (LAGEVRIO) 200 mg CAPS capsule    Sig: Take 4 capsules (800 mg total) by mouth 2 (two) times daily for 5 days.    Dispense:  40 capsule    Refill:  0    Order Specific Question:   Supervising Provider    Answer:   Chase Picket [[id number]]   fluticasone (FLONASE) 50 MCG/ACT nasal spray    Sig: Place 2 sprays into both nostrils daily.    Dispense:  16 g    Refill:  0    Order Specific Question:   Supervising Provider    Answer:   Chase Picket [id number]   promethazine-dextromethorphan (PROMETHAZINE-DM) 6.25-15 MG/5ML syrup    Sig: Take 5 mLs by mouth 4 (four) times daily as needed.    Dispense:  118 mL    Refill:   0    Order Specific Question:   Supervising Provider    Answer:   Chase Picket [[id number]]   benzonatate (TESSALON) 100 MG capsule    Sig: Take 1 capsule (100 mg total) by mouth 3 (three) times daily as needed.    Dispense:  30 capsule    Refill:  0    Order Specific Question:   Supervising Provider    Answer:   Chase Picket [id number]     *If you need refills on other medications prior to your next appointment, please contact your pharmacy*  Follow-Up: Call back or seek an in-person evaluation if the symptoms worsen or if the condition fails to improve as anticipated.  [place] [phone]  Other Instructions  COVID-19 COVID-19, or coronavirus disease 2019, is an infection that is caused by a new (novel) coronavirus called SARS-CoV-2. COVID-19 can cause many symptoms. In some people, the virus may not cause any symptoms. In others, it may cause mild or severe symptoms. Some people with severe infection develop severe disease. What are the causes? This illness is caused by a virus. The virus may be in the air as tiny specks of fluid (aerosols) or droplets, or it may be on surfaces. You may catch the virus by: Breathing in droplets from an infected person. Droplets can be spread by a person breathing, speaking, singing, coughing, or sneezing. Touching something, like a table or a doorknob, that has virus on it (is contaminated) and then touching your mouth, nose, or eyes. What increases the risk? Risk for infection: You are more likely to get infected with the COVID-19 virus if: You are within 6 ft (1.8 m) of a person with COVID-19 for 15 minutes or longer. You are providing care for a person who is infected with COVID-19. You are in close personal contact with other people. Close personal contact includes hugging, kissing, or sharing eating or drinking utensils. Risk for serious illness caused by COVID-19: You are more likely to get seriously ill from the  COVID-19 virus if: You have cancer. You have a long-term (chronic) disease, such as: Chronic lung disease. This includes pulmonary embolism, chronic obstructive pulmonary disease, and cystic fibrosis. Long-term disease that lowers your body's ability to fight infection (immunocompromise). Serious cardiac conditions, such as heart failure, coronary artery disease, or cardiomyopathy. Diabetes. Chronic kidney disease. Liver diseases. These include cirrhosis, nonalcoholic fatty liver disease, alcoholic liver disease, or autoimmune hepatitis. You have obesity. You are pregnant or were recently pregnant. You have sickle cell disease. What are the signs or symptoms? Symptoms of this condition can  range from mild to severe. Symptoms may appear any time from 2 to 14 days after being exposed to the virus. They include: Fever or chills. Shortness of breath or trouble breathing. Feeling tired or very tired. Headaches, body aches, or muscle aches. Runny or stuffy nose, sneezing, coughing, or sore throat. New loss of taste or smell. This is rare. Some people may also have stomach problems, such as nausea, vomiting, or diarrhea. Other people may not have any symptoms of COVID-19. How is this diagnosed? This condition may be diagnosed by testing samples to check for the COVID-19 virus. The most common tests are the PCR test and the antigen test. Tests may be done in the lab or at home. They include: Using a swab to take a sample of fluid from the back of your nose and throat (nasopharyngeal fluid), from your nose, or from your throat. Testing a sample of saliva from your mouth. Testing a sample of coughed-up mucus from your lungs (sputum). How is this treated? Treatment for COVID-19 infection depends on the severity of the condition. Mild symptoms can be managed at home with rest, fluids, and over-the-counter medicines. Serious symptoms may be treated in a hospital intensive care unit (ICU). Treatment  in the ICU may include: Supplemental oxygen. Extra oxygen is given through a tube in the nose, a face mask, or a hood. Medicines. These may include: Antivirals, such as monoclonal antibodies. These help your body fight off certain viruses that can cause disease. Anti-inflammatories, such as corticosteroids. These reduce inflammation and suppress the immune system. Antithrombotics. These prevent or treat blood clots, if they develop. Convalescent plasma. This helps boost your immune system, if you have an underlying immunosuppressive condition or are getting immunosuppressive treatments. Prone positioning. This means you will lie on your stomach. This helps oxygen to get into your lungs. Infection control measures. If you are at risk for more serious illness caused by COVID-19, your health care provider may prescribe two long-acting monoclonal antibodies, given together every 6 months. How is this prevented? To protect yourself: Use preventive medicine (pre-exposure prophylaxis). You may get pre-exposure prophylaxis if you have moderate or severe immunocompromise. Get vaccinated. Anyone 19 months old or older who meets guidelines can get a COVID-19 vaccine or vaccine series. This includes people who are pregnant or making breast milk (lactating). Get an added dose of COVID-19 vaccine after your first vaccine or vaccine series if you have moderate to severe immunocompromise. This applies if you have had a solid organ transplant or have been diagnosed with an immunocompromising condition. You should get the added dose 4 weeks after you got the first COVID-19 vaccine or vaccine series. If you get an mRNA vaccine, you will need a 3-dose primary series. If you get the J&J/Janssen vaccine, you will need a 2-dose primary series, with the second dose being an mRNA vaccine. Talk to your health care provider about getting experimental monoclonal antibodies. This treatment is approved under emergency use  authorization to prevent severe illness before or after being exposed to the COVID-19 virus. You may be given monoclonal antibodies if: You have moderate or severe immunocompromise. This includes treatments that lower your immune response. People with immunocompromise may not develop protection against COVID-19 when they are vaccinated. You cannot be vaccinated. You may not get a vaccine if you have a severe allergic reaction to the vaccine or its components. You are not fully vaccinated. You are in a facility where COVID-19 is present and: Are in close contact with  a person who is infected with the COVID-19 virus. Are at high risk of being exposed to the COVID-19 virus. You are at risk of illness from new variants of the COVID-19 virus. To protect others: If you have symptoms of COVID-19, take steps to prevent the virus from spreading to others. Stay home. Leave your house only to get medical care. Do not use public transit, if possible. Do not travel while you are sick. Wash your hands often with soap and water for at least 20 seconds. If soap and water are not available, use alcohol-based hand sanitizer. Make sure that all people in your household wash their hands well and often. Cough or sneeze into a tissue or your sleeve or elbow. Do not cough or sneeze into your hand or into the air. Where to find more information Centers for Disease Control and Prevention: CharmC[employer].be World Health Organization: https://www.castaneda.info/ Get help right away if: You have trouble breathing. You have pain or pressure in your chest. You are confused. You have bluish lips and fingernails. You have trouble waking from sleep. You have symptoms that get worse. These symptoms may be an emergency. Get help right away. Call 911. Do not wait to see if the symptoms will go away. Do not drive yourself to the hospital. Summary COVID-19 is an infection that is caused by a new  coronavirus. Sometimes, there are no symptoms. Other times, symptoms range from mild to severe. Some people with a severe COVID-19 infection develop severe disease. The virus that causes COVID-19 can spread from person to person through droplets or aerosols from breathing, speaking, singing, coughing, or sneezing. Mild symptoms of COVID-19 can be managed at home with rest, fluids, and over-the-counter medicines. This information is not intended to replace advice given to you by your health care provider. Make sure you discuss any questions you have with your health care provider. Document Revised: 04/14/2021 Document Reviewed: 04/14/2021 Elsevier Patient Education  [employer].    If you have been instructed to have an in-person evaluation today at a local Urgent Care facility, please use the link below. It will take you to a list of all of our available [facility], including address, phone number and hours of operation. Please do not delay care.  [facility]  If you or a family member do not have a primary care provider, use the link below to schedule a visit and establish care. When you choose a [facility] primary care physician or advanced practice provider, you gain a long-term partner in health. Find a Primary Care Provider  Learn more about 's in-office and virtual care options: [facility]

## 2022-02-03 NOTE — Progress Notes (Signed)
Virtual Visit Consent   Vanessa Osborn, you are scheduled for a virtual visit with a [facility] provider today. Just as with appointments in the office, your consent must be obtained to participate. Your consent will be active for this visit and any virtual visit you may have with one of our providers in the next 365 days. If you have a MyChart account, a copy of this consent can be sent to you electronically.  As this is a virtual visit, video technology does not allow for your provider to perform a traditional examination. This may limit your provider's ability to fully assess your condition. If your provider identifies any concerns that need to be evaluated in person or the need to arrange testing (such as labs, EKG, etc.), we will make arrangements to do so. Although advances in technology are sophisticated, we cannot ensure that it will always work on either your end or our end. If the connection with a video visit is poor, the visit may have to be switched to a telephone visit. With either a video or telephone visit, we are not always able to ensure that we have a secure connection.  By engaging in this virtual visit, you consent to the provision of healthcare and authorize for your insurance to be billed (if applicable) for the services provided during this visit. Depending on your insurance coverage, you may receive a charge related to this service.  I need to obtain your verbal consent now. Are you willing to proceed with your visit today? Vanessa Osborn has provided verbal consent on 02/03/2022 for a virtual visit (video or telephone). Mar Daring, PA-C  Date: 02/03/2022 8:56 AM  Virtual Visit via Video Note   I, Mar Daring, connected with  Vanessa Osborn  ([id number], Sep 30, 1990) on 02/03/22 at  8:45 AM EDT by a video-enabled telemedicine application and verified that I am speaking with the correct person using two identifiers.  Location: Patient: Virtual Visit  Location Patient: Home Provider: Virtual Visit Location Provider: Home Office   I discussed the limitations of evaluation and management by telemedicine and the availability of in person appointments. The patient expressed understanding and agreed to proceed.    History of Present Illness: Vanessa Osborn is a 31 y.o. who identifies as a nonbinary who was assigned adult at birth, and is being seen today for Covid 19.  HPI: URI  This is a new problem. The current episode started in the past 7 days (Symptoms started late Monday night; Tested positive on at home test for Covid 19 this morning). The problem has been gradually worsening. Maximum temperature: 99.5. Associated symptoms include chest pain, congestion, coughing, headaches, nausea, rhinorrhea (post nasal drainage), sinus pain and a sore throat (scratchy). Pertinent negatives include no diarrhea, ear pain, plugged ear sensation or vomiting. Vanessa Osborn "Vanessa Osborn" has tried increased fluids and acetaminophen (Tylenol 6106m q 8 hrs) for the symptoms. The treatment provided no relief.     Problems: There are no problems to display for this patient.   Allergies: No Known Allergies Medications:  Current Outpatient Medications:    benzonatate (TESSALON) 100 MG capsule, Take 1 capsule (100 mg total) by mouth 3 (three) times daily as needed., Disp: 30 capsule, Rfl: 0   fluticasone (FLONASE) 50 MCG/ACT nasal spray, Place 2 sprays into both nostrils daily., Disp: 16 g, Rfl: 0   molnupiravir EUA (LAGEVRIO) 200 mg CAPS capsule, Take 4 capsules (800 mg total) by mouth 2 (two) times  daily for 5 days., Disp: 40 capsule, Rfl: 0   promethazine-dextromethorphan (PROMETHAZINE-DM) 6.25-15 MG/5ML syrup, Take 5 mLs by mouth 4 (four) times daily as needed., Disp: 118 mL, Rfl: 0   ALPRAZolam (XANAX) 0.25 MG tablet, Take 1 tablet by mouth twice a day 30 days, Disp: 30 tablet, Rfl: 0   ALPRAZolam (XANAX) 0.25 MG tablet, Take 1 tablet by mouth twice daily, Disp:  30 tablet, Rfl: 0   COVID-19 At Home Antigen Test (CARESTART COVID-19 HOME TEST) KIT, Use as directed, Disp: 4 each, Rfl: 0   cyclobenzaprine (FLEXERIL) 10 MG tablet, Take 1 tablet (10 mg total) by mouth every 8 (eight) hours as needed., Disp: 30 tablet, Rfl: 0   escitalopram (LEXAPRO) 10 MG tablet, TAKE 2 TABLETS BY MOUTH ONCE A DAY, Disp: 60 tablet, Rfl: 2   escitalopram (LEXAPRO) 20 MG tablet, TAKE 1 TABLET BY MOUTH ONCE A DAY, Disp: 30 tablet, Rfl: 2   escitalopram (LEXAPRO) 20 MG tablet, Take 1 tablet by mouth once a day, Disp: 90 tablet, Rfl: 1   famotidine (PEPCID) 40 MG tablet, TAKE 1 TABLET BY MOUTH TWICE DAILY, Disp: 180 tablet, Rfl: 1   famotidine (PEPCID) 40 MG tablet, TAKE 1 TABLET BY MOUTH 2 TIMES DAILY, Disp: 180 tablet, Rfl: 1   famotidine (PEPCID) 40 MG tablet, TAKE 1 TABLET BY MOUTH TWO TIMES DAILY, Disp: 180 tablet, Rfl: 1   famotidine (PEPCID) 40 MG tablet, Take 1 tablet by mouth twice a day, Disp: 180 tablet, Rfl: 1   meloxicam (MOBIC) 15 MG tablet, Take 1 tablet by mouth once a day, Disp: 14 tablet, Rfl: 0   meloxicam (MOBIC) 15 MG tablet, Take 1 tablet by mouth once a day for 14 days, Disp: 14 tablet, Rfl: 0   norethindrone-ethinyl estradiol (JUNEL 1/20) 1-20 MG-MCG tablet, Take 1 tablet by mouth once a day continuosly, Disp: 84 tablet, Rfl: 4   norethindrone-ethinyl estradiol (LOESTRIN) 1-20 MG-MCG tablet, Take 1 tablet by mouth daily., Disp: 90 tablet, Rfl: 4   olmesartan (BENICAR) 20 MG tablet, TAKE 1 TABLET BY MOUTH ONCE A DAY, Disp: 90 tablet, Rfl: 1   olmesartan (BENICAR) 20 MG tablet, TAKE 1 TABLET BY MOUTH ONCE DAILY, Disp: 90 tablet, Rfl: 1   olmesartan (BENICAR) 20 MG tablet, TAKE 1 TABLET BY MOUTH ONCE A DAY, Disp: 90 tablet, Rfl: 1   olmesartan (BENICAR) 20 MG tablet, Take 1 tablet by mouth once a day, Disp: 90 tablet, Rfl: 1   traMADol (ULTRAM) 50 MG tablet, Take 1 tablet (50 mg total) by mouth 2 (two) times daily as needed for pain, Disp: 6 tablet, Rfl:  0  Observations/Objective: Patient is well-developed, well-nourished in no acute distress.  Resting comfortably at home.  Head is normocephalic, atraumatic.  No labored breathing.  Speech is clear and coherent with logical content.  Patient is alert and oriented at baseline.    Assessment and Plan: 1. COVID-19 - molnupiravir EUA (LAGEVRIO) 200 mg CAPS capsule; Take 4 capsules (800 mg total) by mouth 2 (two) times daily for 5 days.  Dispense: 40 capsule; Refill: 0 - fluticasone (FLONASE) 50 MCG/ACT nasal spray; Place 2 sprays into both nostrils daily.  Dispense: 16 g; Refill: 0 - promethazine-dextromethorphan (PROMETHAZINE-DM) 6.25-15 MG/5ML syrup; Take 5 mLs by mouth 4 (four) times daily as needed.  Dispense: 118 mL; Refill: 0 - benzonatate (TESSALON) 100 MG capsule; Take 1 capsule (100 mg total) by mouth 3 (three) times daily as needed.  Dispense: 30 capsule; Refill: 0  -  Continue OTC symptomatic management of choice - Will send OTC vitamins and supplement information through [facility] - Molnupiravir, Flonase, Promethazine DM and Tessalon perles prescribed - Patient enrolled in MyChart symptom monitoring - Push fluids - Rest as needed - Discussed return precautions and when to seek in-person evaluation, sent via [facility] as well   Follow Up Instructions: I discussed the assessment and treatment plan with the patient. The patient was provided an opportunity to ask questions and all were answered. The patient agreed with the plan and demonstrated an understanding of the instructions.  A copy of instructions were sent to the patient via MyChart unless otherwise noted below.    The patient was advised to call back or seek an in-person evaluation if the symptoms worsen or if the condition fails to improve as anticipated.  Time:  I spent 12 minutes with the patient via telehealth technology discussing the above problems/concerns.    Mar Daring, PA-C

## 2022-02-08 ENCOUNTER — Other Ambulatory Visit (HOSPITAL_COMMUNITY): Payer: Self-pay

## 2022-02-08 MED ORDER — ALPRAZOLAM 0.25 MG PO TABS
0.2500 mg | ORAL_TABLET | Freq: Two times a day (BID) | ORAL | 0 refills | Status: AC
  Filled 2022-02-08: qty 30, 15d supply, fill #0

## 2022-02-10 ENCOUNTER — Other Ambulatory Visit (HOSPITAL_COMMUNITY): Payer: Self-pay

## 2022-03-07 ENCOUNTER — Other Ambulatory Visit (HOSPITAL_COMMUNITY): Payer: Self-pay

## 2022-04-04 ENCOUNTER — Other Ambulatory Visit (HOSPITAL_COMMUNITY): Payer: Self-pay

## 2022-04-04 MED ORDER — ALPRAZOLAM 0.25 MG PO TABS
0.2500 mg | ORAL_TABLET | Freq: Two times a day (BID) | ORAL | 0 refills | Status: AC | PRN
  Filled 2022-04-04: qty 30, 15d supply, fill #0

## 2022-04-10 ENCOUNTER — Other Ambulatory Visit (HOSPITAL_COMMUNITY): Payer: Self-pay

## 2022-04-10 MED ORDER — VALACYCLOVIR HCL 1 G PO TABS
2000.0000 mg | ORAL_TABLET | Freq: Two times a day (BID) | ORAL | 2 refills | Status: DC | PRN
  Filled 2022-04-10: qty 8, 2d supply, fill #0
  Filled 2022-07-07: qty 8, 2d supply, fill #1
  Filled 2023-01-02: qty 8, 2d supply, fill #2

## 2022-04-11 ENCOUNTER — Other Ambulatory Visit (HOSPITAL_COMMUNITY): Payer: Self-pay

## 2022-05-09 ENCOUNTER — Other Ambulatory Visit (HOSPITAL_COMMUNITY): Payer: Self-pay

## 2022-05-09 ENCOUNTER — Other Ambulatory Visit: Payer: Self-pay

## 2022-05-09 MED ORDER — ESCITALOPRAM OXALATE 20 MG PO TABS
20.0000 mg | ORAL_TABLET | Freq: Every day | ORAL | 0 refills | Status: DC
  Filled 2022-05-09: qty 90, 90d supply, fill #0

## 2022-05-17 ENCOUNTER — Other Ambulatory Visit (HOSPITAL_COMMUNITY): Payer: Self-pay

## 2022-05-17 ENCOUNTER — Other Ambulatory Visit: Payer: Self-pay

## 2022-05-17 DIAGNOSIS — F324 Major depressive disorder, single episode, in partial remission: Secondary | ICD-10-CM | POA: Diagnosis not present

## 2022-05-17 DIAGNOSIS — R5383 Other fatigue: Secondary | ICD-10-CM | POA: Diagnosis not present

## 2022-05-17 DIAGNOSIS — R7303 Prediabetes: Secondary | ICD-10-CM | POA: Diagnosis not present

## 2022-05-17 DIAGNOSIS — G43909 Migraine, unspecified, not intractable, without status migrainosus: Secondary | ICD-10-CM | POA: Diagnosis not present

## 2022-05-17 DIAGNOSIS — I10 Essential (primary) hypertension: Secondary | ICD-10-CM | POA: Diagnosis not present

## 2022-05-17 DIAGNOSIS — F411 Generalized anxiety disorder: Secondary | ICD-10-CM | POA: Diagnosis not present

## 2022-05-17 DIAGNOSIS — K219 Gastro-esophageal reflux disease without esophagitis: Secondary | ICD-10-CM | POA: Diagnosis not present

## 2022-05-17 DIAGNOSIS — R0683 Snoring: Secondary | ICD-10-CM | POA: Diagnosis not present

## 2022-05-17 DIAGNOSIS — Z820 Family history of epilepsy and other diseases of the nervous system: Secondary | ICD-10-CM | POA: Diagnosis not present

## 2022-05-17 MED ORDER — FAMOTIDINE 40 MG PO TABS
40.0000 mg | ORAL_TABLET | Freq: Two times a day (BID) | ORAL | 1 refills | Status: AC
  Filled 2022-05-17: qty 180, 90d supply, fill #0

## 2022-05-17 MED ORDER — ALPRAZOLAM 0.25 MG PO TABS
0.2500 mg | ORAL_TABLET | Freq: Two times a day (BID) | ORAL | 0 refills | Status: AC | PRN
  Filled 2022-05-17: qty 30, 15d supply, fill #0

## 2022-05-17 MED ORDER — OLMESARTAN MEDOXOMIL 20 MG PO TABS
20.0000 mg | ORAL_TABLET | Freq: Every day | ORAL | 1 refills | Status: DC
  Filled 2022-05-17 – 2022-07-07 (×2): qty 90, 90d supply, fill #0
  Filled 2022-10-01: qty 90, 90d supply, fill #1

## 2022-05-17 MED ORDER — ESCITALOPRAM OXALATE 20 MG PO TABS
20.0000 mg | ORAL_TABLET | Freq: Every day | ORAL | 1 refills | Status: DC
  Filled 2022-05-17 – 2022-08-05 (×3): qty 90, 90d supply, fill #0
  Filled 2022-11-05: qty 90, 90d supply, fill #1

## 2022-05-18 ENCOUNTER — Other Ambulatory Visit: Payer: Self-pay

## 2022-05-22 ENCOUNTER — Encounter (HOSPITAL_COMMUNITY): Payer: Self-pay

## 2022-05-22 ENCOUNTER — Other Ambulatory Visit: Payer: Self-pay

## 2022-05-22 ENCOUNTER — Emergency Department (HOSPITAL_COMMUNITY)
Admission: EM | Admit: 2022-05-22 | Discharge: 2022-05-22 | Discharge status: Against Medical Advice | Payer: Commercial Managed Care - PPO | Attending: Emergency Medicine | Admitting: Emergency Medicine

## 2022-05-22 DIAGNOSIS — Z5321 Procedure and treatment not carried out due to patient leaving prior to being seen by health care provider: Secondary | ICD-10-CM | POA: Diagnosis not present

## 2022-05-22 DIAGNOSIS — R202 Paresthesia of skin: Secondary | ICD-10-CM | POA: Insufficient documentation

## 2022-05-22 HISTORY — DX: Essential (primary) hypertension: I10

## 2022-05-22 NOTE — ED Triage Notes (Signed)
Patient has had numbness in her left hand, forearm and today it now is in her bicep since Saturday. No headache.

## 2022-05-22 NOTE — ED Provider Triage Note (Signed)
Emergency Medicine Provider Triage Evaluation Note  Vanessa Osborn , a 32 y.o. adult  was evaluated in triage.  Pt complains of paresthesias to left arm x 2 days. Started in hand then moved to forearm later that day and now up to left shoulder. No focal weakness, headache, fever, neck pain, back pain, vision changes, abdominal pain, nausea, vomiting, chest pain, SOB, diarrhea, or other complaints. Has not injured neck, back, or left arm.   Review of Systems  Positive: See HPI Negative: See HPI  Physical Exam  BP 129/73 (BP Location: Left Arm)   Pulse 64   Temp 97.7 F (36.5 C) (Oral)   Resp 16   Ht 5\' 6"  (1.676 m)   Wt 115.7 kg   SpO2 100%   BMI 41.16 kg/m  Gen:   Awake, no distress   Resp:  Normal effort, LCTA MSK:   Moves extremities without difficulty  Other:  RRR, 5/5 strength bilateral UE and LE, normal speech, CNI, normal objective sensation on exam.   Medical Decision Making  Medically screening exam initiated at 1:52 PM.  Appropriate orders placed.  Vanessa Osborn was informed that the remainder of the evaluation will be completed by another provider, this initial triage assessment does not replace that evaluation, and the importance of remaining in the ED until their evaluation is complete.  Will opt to leave AMA. Had blood work completed at PCP 1/10 that was normal. Attributes symptoms to anxiety. States it is easy to follow up with PCP and will do this instead. Offered blood work and imaging, pt declined multiple times. Discussed strict return precautions. Pt will sign out against medical advice and return if needed.    Suzzette Righter, PA-C 05/22/22 1515

## 2022-07-08 ENCOUNTER — Other Ambulatory Visit (HOSPITAL_COMMUNITY): Payer: Self-pay

## 2022-07-10 ENCOUNTER — Other Ambulatory Visit: Payer: Self-pay

## 2022-08-05 ENCOUNTER — Other Ambulatory Visit (HOSPITAL_COMMUNITY): Payer: Self-pay

## 2022-08-06 IMAGING — US US EXTREM LOW VENOUS*L*
1 series · 13 of 24 positions shown · non-contrast
Comparison: None.

CLINICAL DATA: Left leg pain



[Series 1: us venous img lower uni left (dvt) · portal-venous · 13 of 49 slices shown]
[im 1/49]
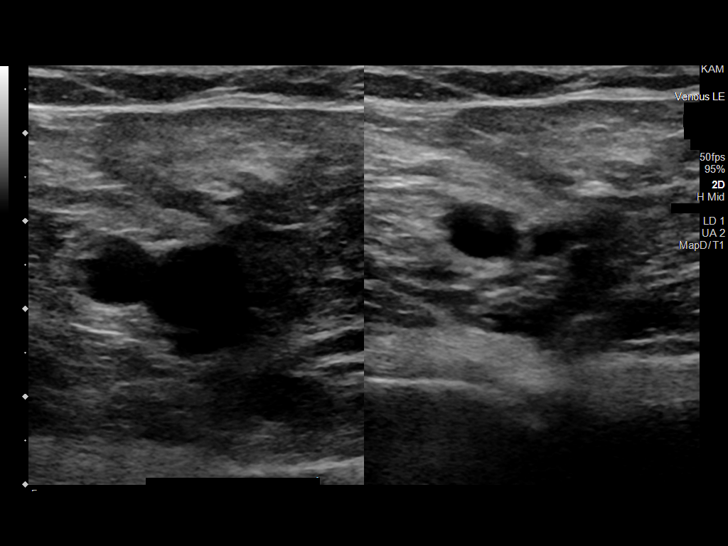
[im 5/49]
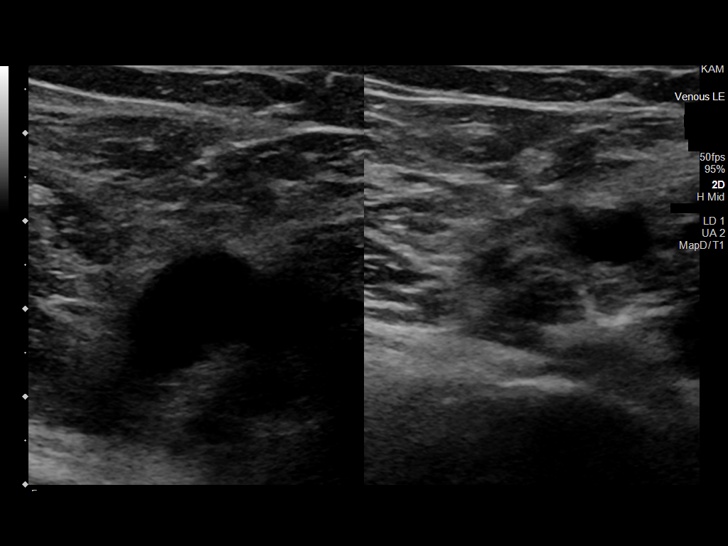
[im 9/49]
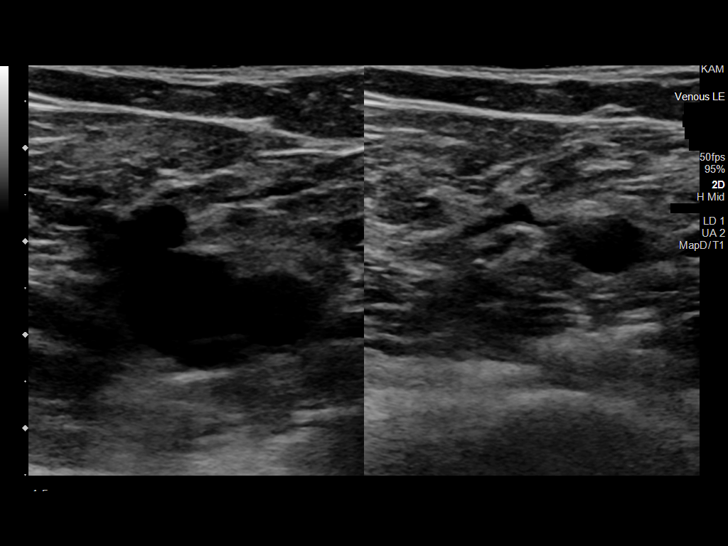
[im 13/49]
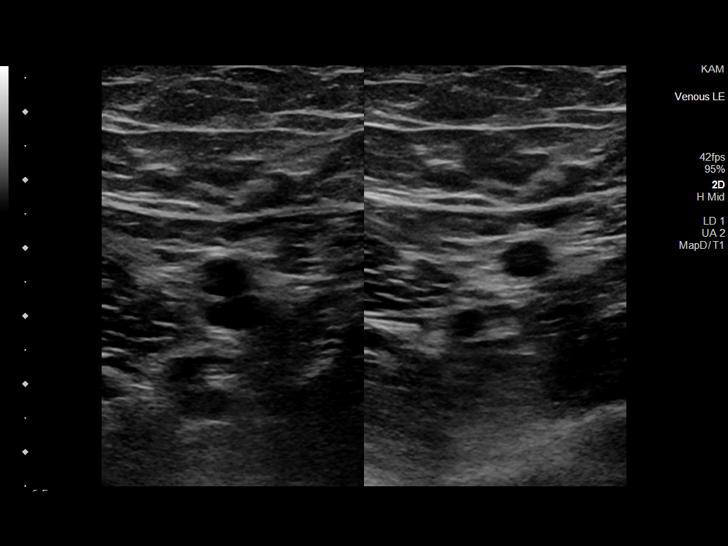
[im 17/49]
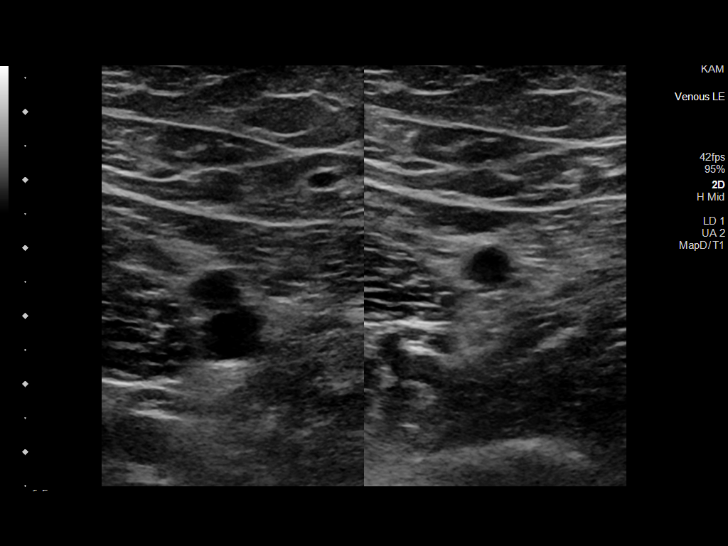
[im 21/49]
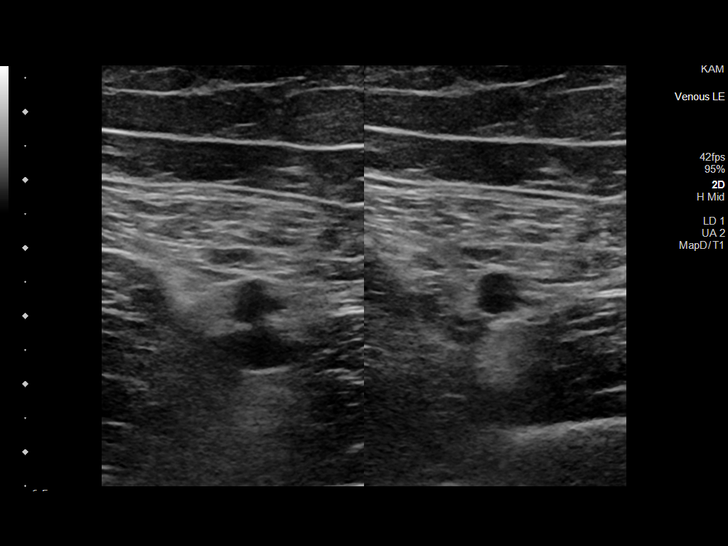
[im 26/49]
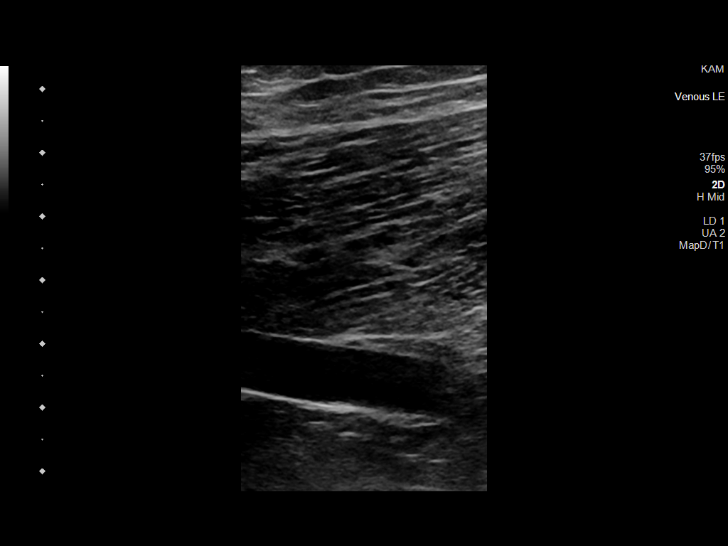
[im 28/49]
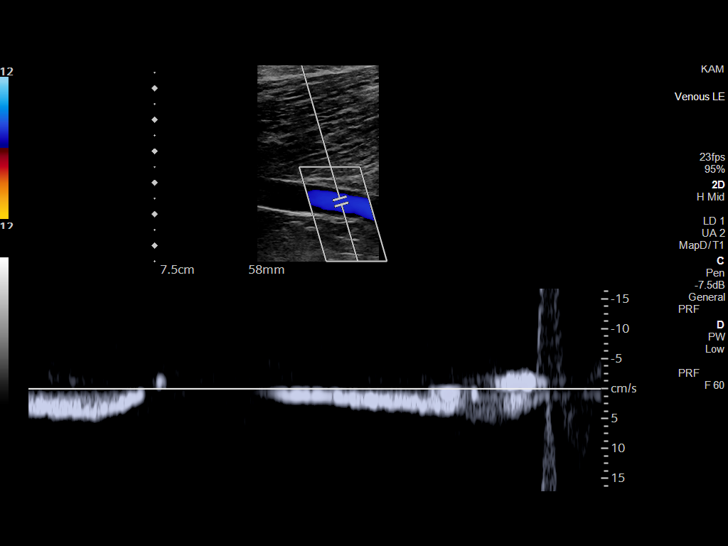
[im 32/49]
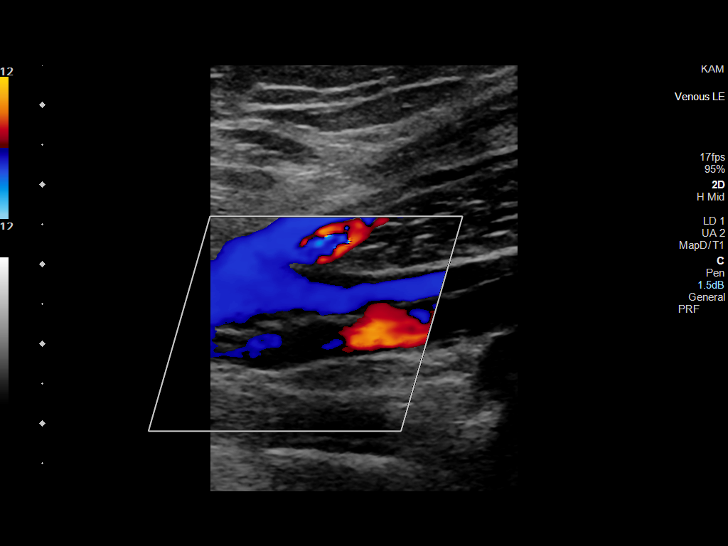
[im 36/49]
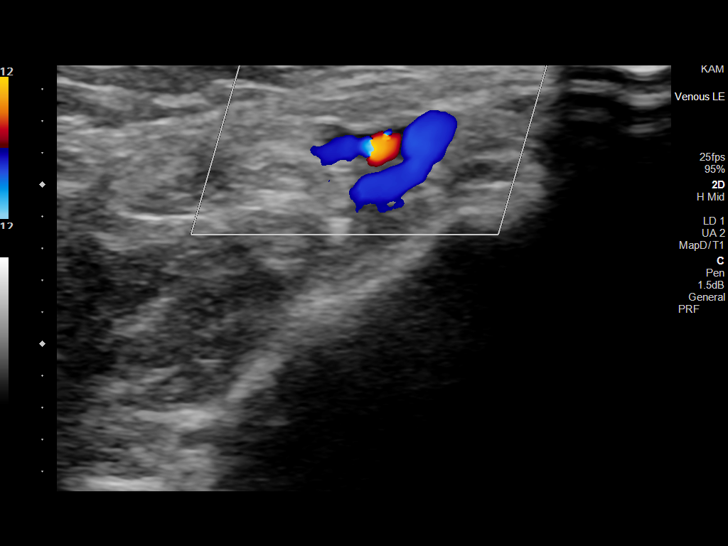
[im 40/49]
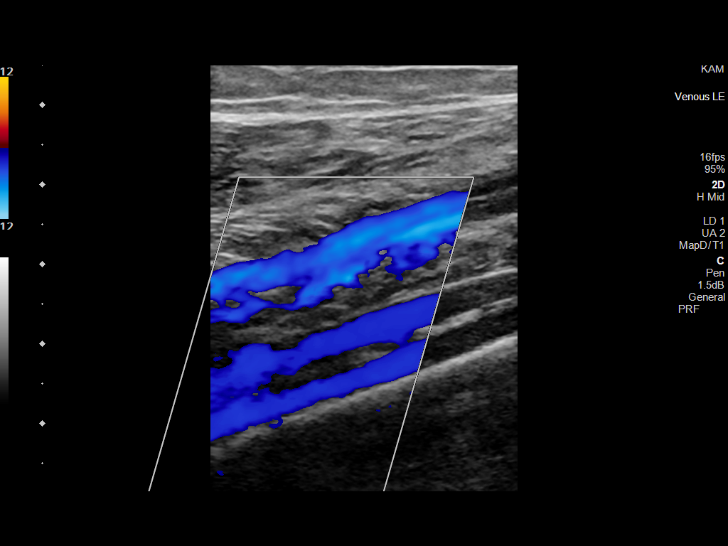
[im 44/49]
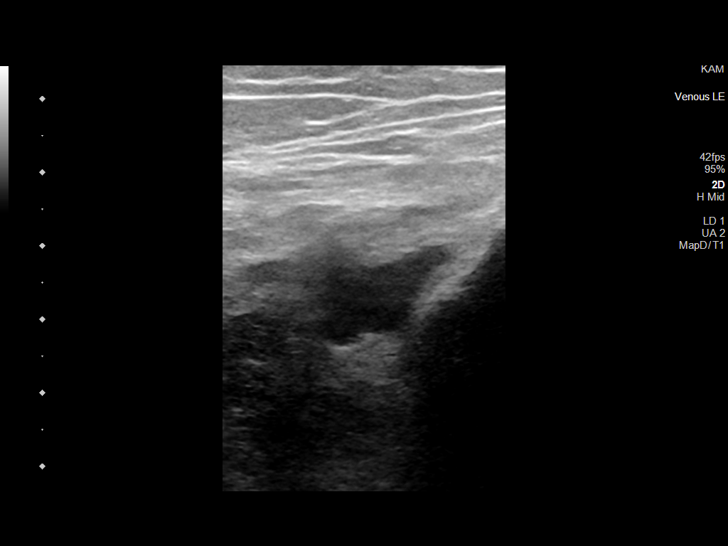
[im 49/49]
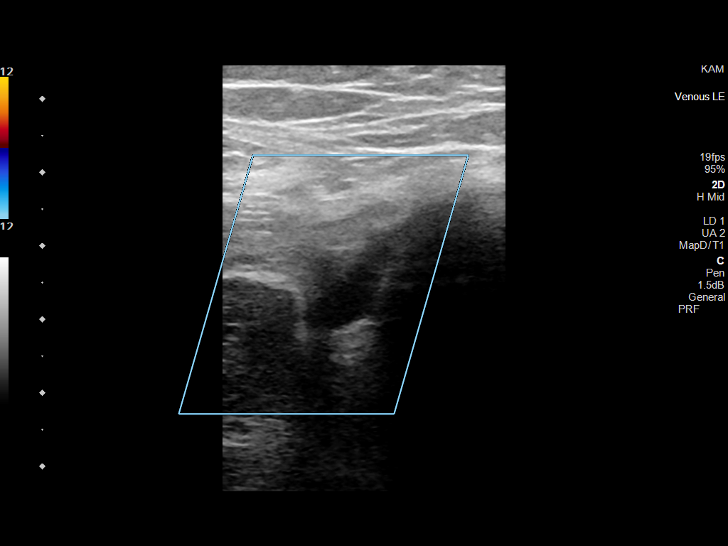

[13 of 24 positions shown; findings below may reference images not displayed]

FINDINGS: Contralateral Common Femoral Vein: Respiratory phasicity is normal
and symmetric with the symptomatic side. No evidence of thrombus.
Normal compressibility.

Common Femoral Vein: No evidence of thrombus. Normal
compressibility, respiratory phasicity and response to augmentation.

Saphenofemoral Junction: No evidence of thrombus. Normal
compressibility and flow on color Doppler imaging.

Profunda Femoral Vein: No evidence of thrombus. Normal
compressibility and flow on color Doppler imaging.

Femoral Vein: No evidence of thrombus. Normal compressibility,
respiratory phasicity and response to augmentation.

Popliteal Vein: No evidence of thrombus. Normal compressibility,
respiratory phasicity and response to augmentation.

Calf Veins: No evidence of thrombus. Normal compressibility and flow
on color Doppler imaging.

Other Findings: The left GSV is compressible throughout left
extremity. Note is made 2.2 x 1.2 cm hypoechoic, avascular fluid
collection in the left popliteal fossa, compatible with a Baker's
cyst.
IMPRESSION: No evidence of deep venous thrombosis.

## 2022-08-07 ENCOUNTER — Other Ambulatory Visit: Payer: Self-pay

## 2022-08-16 ENCOUNTER — Other Ambulatory Visit (HOSPITAL_COMMUNITY): Payer: Self-pay

## 2022-08-17 ENCOUNTER — Other Ambulatory Visit (HOSPITAL_COMMUNITY): Payer: Self-pay

## 2022-08-17 MED ORDER — ALPRAZOLAM 0.25 MG PO TABS
0.2500 mg | ORAL_TABLET | Freq: Two times a day (BID) | ORAL | 0 refills | Status: AC
  Filled 2022-08-17: qty 30, 15d supply, fill #0

## 2022-08-28 ENCOUNTER — Other Ambulatory Visit (HOSPITAL_COMMUNITY): Payer: Self-pay

## 2022-10-03 ENCOUNTER — Other Ambulatory Visit: Payer: Self-pay

## 2022-11-06 ENCOUNTER — Other Ambulatory Visit: Payer: Self-pay

## 2022-11-22 ENCOUNTER — Other Ambulatory Visit (HOSPITAL_COMMUNITY): Payer: Self-pay

## 2022-11-22 ENCOUNTER — Other Ambulatory Visit: Payer: Self-pay

## 2022-11-22 DIAGNOSIS — R5383 Other fatigue: Secondary | ICD-10-CM | POA: Diagnosis not present

## 2022-11-22 DIAGNOSIS — F411 Generalized anxiety disorder: Secondary | ICD-10-CM | POA: Diagnosis not present

## 2022-11-22 DIAGNOSIS — Z13220 Encounter for screening for lipoid disorders: Secondary | ICD-10-CM | POA: Diagnosis not present

## 2022-11-22 DIAGNOSIS — F324 Major depressive disorder, single episode, in partial remission: Secondary | ICD-10-CM | POA: Diagnosis not present

## 2022-11-22 DIAGNOSIS — K219 Gastro-esophageal reflux disease without esophagitis: Secondary | ICD-10-CM | POA: Diagnosis not present

## 2022-11-22 DIAGNOSIS — I10 Essential (primary) hypertension: Secondary | ICD-10-CM | POA: Diagnosis not present

## 2022-11-22 DIAGNOSIS — E559 Vitamin D deficiency, unspecified: Secondary | ICD-10-CM | POA: Diagnosis not present

## 2022-11-22 DIAGNOSIS — R7303 Prediabetes: Secondary | ICD-10-CM | POA: Diagnosis not present

## 2022-11-22 DIAGNOSIS — Z0000 Encounter for general adult medical examination without abnormal findings: Secondary | ICD-10-CM | POA: Diagnosis not present

## 2022-11-22 DIAGNOSIS — G43909 Migraine, unspecified, not intractable, without status migrainosus: Secondary | ICD-10-CM | POA: Diagnosis not present

## 2022-11-22 MED ORDER — ALPRAZOLAM 0.25 MG PO TABS
0.2500 mg | ORAL_TABLET | Freq: Two times a day (BID) | ORAL | 1 refills | Status: AC
  Filled 2022-11-22: qty 30, 15d supply, fill #0
  Filled 2023-02-23: qty 30, 15d supply, fill #1

## 2022-11-22 MED ORDER — FAMOTIDINE 40 MG PO TABS
40.0000 mg | ORAL_TABLET | Freq: Two times a day (BID) | ORAL | 1 refills | Status: AC
  Filled 2022-11-22: qty 180, 90d supply, fill #0

## 2022-11-22 MED ORDER — OLMESARTAN MEDOXOMIL 20 MG PO TABS
20.0000 mg | ORAL_TABLET | Freq: Every day | ORAL | 1 refills | Status: DC
  Filled 2022-12-24: qty 90, 90d supply, fill #0
  Filled 2023-03-21: qty 90, 90d supply, fill #1

## 2022-11-22 MED ORDER — ESCITALOPRAM OXALATE 20 MG PO TABS
20.0000 mg | ORAL_TABLET | Freq: Every day | ORAL | 1 refills | Status: DC
  Filled 2023-02-02: qty 90, 90d supply, fill #0
  Filled 2023-04-30: qty 90, 90d supply, fill #1

## 2022-12-24 ENCOUNTER — Other Ambulatory Visit (HOSPITAL_COMMUNITY): Payer: Self-pay

## 2022-12-25 ENCOUNTER — Other Ambulatory Visit: Payer: Self-pay

## 2023-01-02 ENCOUNTER — Other Ambulatory Visit (HOSPITAL_COMMUNITY): Payer: Self-pay

## 2023-01-16 DIAGNOSIS — H5203 Hypermetropia, bilateral: Secondary | ICD-10-CM | POA: Diagnosis not present

## 2023-01-29 ENCOUNTER — Other Ambulatory Visit (HOSPITAL_COMMUNITY): Payer: Self-pay

## 2023-01-29 DIAGNOSIS — Z3041 Encounter for surveillance of contraceptive pills: Secondary | ICD-10-CM | POA: Diagnosis not present

## 2023-01-29 DIAGNOSIS — Z01419 Encounter for gynecological examination (general) (routine) without abnormal findings: Secondary | ICD-10-CM | POA: Diagnosis not present

## 2023-01-29 MED ORDER — NORETHINDRONE ACET-ETHINYL EST 1-20 MG-MCG PO TABS
1.0000 | ORAL_TABLET | Freq: Every day | ORAL | 4 refills | Status: DC
  Filled 2023-02-02 – 2023-03-01 (×4): qty 84, 84d supply, fill #0
  Filled 2023-06-09: qty 84, 84d supply, fill #1
  Filled 2023-09-08: qty 84, 84d supply, fill #2
  Filled 2023-11-04 – 2023-11-26 (×2): qty 84, 84d supply, fill #3

## 2023-02-02 ENCOUNTER — Other Ambulatory Visit: Payer: Self-pay

## 2023-02-02 ENCOUNTER — Other Ambulatory Visit (HOSPITAL_COMMUNITY): Payer: Self-pay

## 2023-02-03 ENCOUNTER — Other Ambulatory Visit (HOSPITAL_COMMUNITY): Payer: Self-pay

## 2023-02-23 ENCOUNTER — Other Ambulatory Visit: Payer: Self-pay

## 2023-02-23 ENCOUNTER — Other Ambulatory Visit (HOSPITAL_COMMUNITY): Payer: Self-pay

## 2023-02-26 ENCOUNTER — Other Ambulatory Visit (HOSPITAL_COMMUNITY): Payer: Self-pay

## 2023-02-26 MED ORDER — VALACYCLOVIR HCL 1 G PO TABS
2000.0000 mg | ORAL_TABLET | Freq: Two times a day (BID) | ORAL | 2 refills | Status: DC
  Filled 2023-02-26: qty 8, 2d supply, fill #0
  Filled 2023-08-19: qty 8, 2d supply, fill #1
  Filled 2024-01-21: qty 8, 2d supply, fill #2

## 2023-02-27 ENCOUNTER — Other Ambulatory Visit: Payer: Self-pay

## 2023-02-27 ENCOUNTER — Other Ambulatory Visit (HOSPITAL_COMMUNITY): Payer: Self-pay

## 2023-03-01 ENCOUNTER — Other Ambulatory Visit: Payer: Self-pay

## 2023-03-01 ENCOUNTER — Other Ambulatory Visit (HOSPITAL_COMMUNITY): Payer: Self-pay

## 2023-03-21 ENCOUNTER — Other Ambulatory Visit: Payer: Self-pay

## 2023-03-21 ENCOUNTER — Other Ambulatory Visit (HOSPITAL_COMMUNITY): Payer: Self-pay

## 2023-03-22 ENCOUNTER — Other Ambulatory Visit (HOSPITAL_COMMUNITY): Payer: Self-pay

## 2023-03-22 ENCOUNTER — Other Ambulatory Visit: Payer: Self-pay

## 2023-03-22 MED ORDER — ALPRAZOLAM 0.25 MG PO TABS
0.2500 mg | ORAL_TABLET | Freq: Every day | ORAL | 1 refills | Status: AC | PRN
  Filled 2023-03-22: qty 30, 30d supply, fill #0
  Filled 2023-04-21: qty 30, 30d supply, fill #1

## 2023-03-28 ENCOUNTER — Other Ambulatory Visit (HOSPITAL_COMMUNITY): Payer: Self-pay

## 2023-04-21 ENCOUNTER — Other Ambulatory Visit (HOSPITAL_COMMUNITY): Payer: Self-pay

## 2023-04-30 ENCOUNTER — Other Ambulatory Visit: Payer: Self-pay

## 2023-05-29 ENCOUNTER — Other Ambulatory Visit (HOSPITAL_COMMUNITY): Payer: Self-pay

## 2023-05-29 DIAGNOSIS — F411 Generalized anxiety disorder: Secondary | ICD-10-CM | POA: Diagnosis not present

## 2023-05-29 DIAGNOSIS — G43909 Migraine, unspecified, not intractable, without status migrainosus: Secondary | ICD-10-CM | POA: Diagnosis not present

## 2023-05-29 DIAGNOSIS — R5383 Other fatigue: Secondary | ICD-10-CM | POA: Diagnosis not present

## 2023-05-29 DIAGNOSIS — I10 Essential (primary) hypertension: Secondary | ICD-10-CM | POA: Diagnosis not present

## 2023-05-29 DIAGNOSIS — R1310 Dysphagia, unspecified: Secondary | ICD-10-CM | POA: Diagnosis not present

## 2023-05-29 DIAGNOSIS — E559 Vitamin D deficiency, unspecified: Secondary | ICD-10-CM | POA: Diagnosis not present

## 2023-05-29 DIAGNOSIS — F324 Major depressive disorder, single episode, in partial remission: Secondary | ICD-10-CM | POA: Diagnosis not present

## 2023-05-29 DIAGNOSIS — K219 Gastro-esophageal reflux disease without esophagitis: Secondary | ICD-10-CM | POA: Diagnosis not present

## 2023-05-29 DIAGNOSIS — R7303 Prediabetes: Secondary | ICD-10-CM | POA: Diagnosis not present

## 2023-05-29 MED ORDER — ESCITALOPRAM OXALATE 20 MG PO TABS
20.0000 mg | ORAL_TABLET | Freq: Every day | ORAL | 1 refills | Status: DC
  Filled 2023-07-29: qty 90, 90d supply, fill #0
  Filled 2023-09-08 – 2023-10-29 (×2): qty 90, 90d supply, fill #1

## 2023-05-29 MED ORDER — OLMESARTAN MEDOXOMIL 20 MG PO TABS
20.0000 mg | ORAL_TABLET | Freq: Every day | ORAL | 1 refills | Status: DC
  Filled 2023-07-03: qty 90, 90d supply, fill #0
  Filled 2023-09-29: qty 90, 90d supply, fill #1

## 2023-06-05 DIAGNOSIS — R1310 Dysphagia, unspecified: Secondary | ICD-10-CM | POA: Diagnosis not present

## 2023-06-09 ENCOUNTER — Other Ambulatory Visit (HOSPITAL_COMMUNITY): Payer: Self-pay

## 2023-07-03 ENCOUNTER — Other Ambulatory Visit (HOSPITAL_COMMUNITY): Payer: Self-pay

## 2023-07-03 ENCOUNTER — Other Ambulatory Visit: Payer: Self-pay

## 2023-07-29 ENCOUNTER — Other Ambulatory Visit (HOSPITAL_COMMUNITY): Payer: Self-pay

## 2023-07-30 ENCOUNTER — Other Ambulatory Visit: Payer: Self-pay

## 2023-08-20 ENCOUNTER — Other Ambulatory Visit (HOSPITAL_COMMUNITY): Payer: Self-pay

## 2023-09-08 ENCOUNTER — Other Ambulatory Visit (HOSPITAL_COMMUNITY): Payer: Self-pay

## 2023-09-29 ENCOUNTER — Other Ambulatory Visit (HOSPITAL_COMMUNITY): Payer: Self-pay

## 2023-10-29 ENCOUNTER — Other Ambulatory Visit (HOSPITAL_COMMUNITY): Payer: Self-pay

## 2023-11-05 ENCOUNTER — Other Ambulatory Visit: Payer: Self-pay

## 2023-11-12 ENCOUNTER — Other Ambulatory Visit (HOSPITAL_COMMUNITY): Payer: Self-pay

## 2023-11-26 ENCOUNTER — Other Ambulatory Visit: Payer: Self-pay

## 2023-12-04 ENCOUNTER — Other Ambulatory Visit (HOSPITAL_COMMUNITY): Payer: Self-pay

## 2023-12-04 DIAGNOSIS — R7303 Prediabetes: Secondary | ICD-10-CM | POA: Diagnosis not present

## 2023-12-04 DIAGNOSIS — Z13220 Encounter for screening for lipoid disorders: Secondary | ICD-10-CM | POA: Diagnosis not present

## 2023-12-04 DIAGNOSIS — Z0000 Encounter for general adult medical examination without abnormal findings: Secondary | ICD-10-CM | POA: Diagnosis not present

## 2023-12-04 DIAGNOSIS — K219 Gastro-esophageal reflux disease without esophagitis: Secondary | ICD-10-CM | POA: Diagnosis not present

## 2023-12-04 DIAGNOSIS — R5383 Other fatigue: Secondary | ICD-10-CM | POA: Diagnosis not present

## 2023-12-04 DIAGNOSIS — F324 Major depressive disorder, single episode, in partial remission: Secondary | ICD-10-CM | POA: Diagnosis not present

## 2023-12-04 DIAGNOSIS — G43909 Migraine, unspecified, not intractable, without status migrainosus: Secondary | ICD-10-CM | POA: Diagnosis not present

## 2023-12-04 DIAGNOSIS — I10 Essential (primary) hypertension: Secondary | ICD-10-CM | POA: Diagnosis not present

## 2023-12-04 DIAGNOSIS — E559 Vitamin D deficiency, unspecified: Secondary | ICD-10-CM | POA: Diagnosis not present

## 2023-12-04 DIAGNOSIS — F411 Generalized anxiety disorder: Secondary | ICD-10-CM | POA: Diagnosis not present

## 2023-12-04 MED ORDER — OLMESARTAN MEDOXOMIL 20 MG PO TABS
20.0000 mg | ORAL_TABLET | Freq: Every day | ORAL | 1 refills | Status: DC
  Filled 2023-12-30: qty 90, 90d supply, fill #0
  Filled 2024-03-30: qty 90, 90d supply, fill #1

## 2023-12-04 MED ORDER — ESCITALOPRAM OXALATE 20 MG PO TABS
20.0000 mg | ORAL_TABLET | Freq: Every day | ORAL | 1 refills | Status: DC
  Filled 2024-01-29: qty 90, 90d supply, fill #0
  Filled 2024-04-23: qty 90, 90d supply, fill #1

## 2023-12-31 ENCOUNTER — Other Ambulatory Visit: Payer: Self-pay

## 2023-12-31 ENCOUNTER — Encounter: Payer: Self-pay | Admitting: Pharmacist

## 2023-12-31 ENCOUNTER — Other Ambulatory Visit (HOSPITAL_COMMUNITY): Payer: Self-pay

## 2023-12-31 DIAGNOSIS — K2930 Chronic superficial gastritis without bleeding: Secondary | ICD-10-CM | POA: Diagnosis not present

## 2023-12-31 DIAGNOSIS — K2100 Gastro-esophageal reflux disease with esophagitis, without bleeding: Secondary | ICD-10-CM | POA: Diagnosis not present

## 2023-12-31 DIAGNOSIS — K2950 Unspecified chronic gastritis without bleeding: Secondary | ICD-10-CM | POA: Diagnosis not present

## 2023-12-31 DIAGNOSIS — R1310 Dysphagia, unspecified: Secondary | ICD-10-CM | POA: Diagnosis not present

## 2023-12-31 DIAGNOSIS — K317 Polyp of stomach and duodenum: Secondary | ICD-10-CM | POA: Diagnosis not present

## 2023-12-31 MED ORDER — PANTOPRAZOLE SODIUM 40 MG PO TBEC
40.0000 mg | DELAYED_RELEASE_TABLET | Freq: Every morning | ORAL | 2 refills | Status: DC
  Filled 2023-12-31 – 2024-01-01 (×4): qty 30, 30d supply, fill #0
  Filled 2024-01-29: qty 30, 30d supply, fill #1
  Filled 2024-03-04: qty 30, 30d supply, fill #2

## 2024-01-01 ENCOUNTER — Other Ambulatory Visit (HOSPITAL_COMMUNITY): Payer: Self-pay

## 2024-01-01 ENCOUNTER — Other Ambulatory Visit: Payer: Self-pay

## 2024-01-21 ENCOUNTER — Other Ambulatory Visit (HOSPITAL_COMMUNITY): Payer: Self-pay

## 2024-01-29 ENCOUNTER — Other Ambulatory Visit: Payer: Self-pay

## 2024-01-29 ENCOUNTER — Other Ambulatory Visit (HOSPITAL_COMMUNITY): Payer: Self-pay

## 2024-01-29 MED ORDER — VALACYCLOVIR HCL 1 G PO TABS
2.0000 g | ORAL_TABLET | Freq: Two times a day (BID) | ORAL | 0 refills | Status: AC | PRN
  Filled 2024-01-29 (×2): qty 8, 2d supply, fill #0

## 2024-02-18 ENCOUNTER — Other Ambulatory Visit (HOSPITAL_COMMUNITY): Payer: Self-pay

## 2024-02-18 DIAGNOSIS — S39012A Strain of muscle, fascia and tendon of lower back, initial encounter: Secondary | ICD-10-CM | POA: Diagnosis not present

## 2024-02-18 MED ORDER — METHYLPREDNISOLONE 4 MG PO TBPK
ORAL_TABLET | ORAL | 0 refills | Status: AC
  Filled 2024-02-18 (×2): qty 21, 6d supply, fill #0

## 2024-02-18 MED ORDER — CYCLOBENZAPRINE HCL 5 MG PO TABS
5.0000 mg | ORAL_TABLET | Freq: Every evening | ORAL | 0 refills | Status: DC | PRN
  Filled 2024-02-18 (×2): qty 14, 7d supply, fill #0

## 2024-02-18 MED ORDER — MELOXICAM 15 MG PO TABS
15.0000 mg | ORAL_TABLET | Freq: Every day | ORAL | 0 refills | Status: DC
  Filled 2024-02-18 (×2): qty 7, 7d supply, fill #0

## 2024-02-19 ENCOUNTER — Other Ambulatory Visit (HOSPITAL_COMMUNITY): Payer: Self-pay

## 2024-02-19 DIAGNOSIS — R109 Unspecified abdominal pain: Secondary | ICD-10-CM | POA: Diagnosis not present

## 2024-02-19 DIAGNOSIS — Z3041 Encounter for surveillance of contraceptive pills: Secondary | ICD-10-CM | POA: Diagnosis not present

## 2024-02-19 DIAGNOSIS — Z01419 Encounter for gynecological examination (general) (routine) without abnormal findings: Secondary | ICD-10-CM | POA: Diagnosis not present

## 2024-02-19 DIAGNOSIS — N649 Disorder of breast, unspecified: Secondary | ICD-10-CM | POA: Diagnosis not present

## 2024-02-19 MED ORDER — NORETHINDRONE ACET-ETHINYL EST 1-20 MG-MCG PO TABS
1.0000 | ORAL_TABLET | Freq: Every day | ORAL | 4 refills | Status: AC
  Filled 2024-02-19: qty 84, 84d supply, fill #0
  Filled 2024-05-11: qty 84, 84d supply, fill #1

## 2024-03-04 ENCOUNTER — Other Ambulatory Visit (HOSPITAL_COMMUNITY): Payer: Self-pay

## 2024-03-31 ENCOUNTER — Other Ambulatory Visit (HOSPITAL_COMMUNITY): Payer: Self-pay

## 2024-04-11 ENCOUNTER — Other Ambulatory Visit (HOSPITAL_COMMUNITY): Payer: Self-pay

## 2024-04-11 MED ORDER — MELOXICAM 15 MG PO TABS
15.0000 mg | ORAL_TABLET | Freq: Every day | ORAL | 0 refills | Status: AC
  Filled 2024-04-11 (×2): qty 7, 7d supply, fill #0

## 2024-04-11 MED ORDER — CYCLOBENZAPRINE HCL 5 MG PO TABS
5.0000 mg | ORAL_TABLET | Freq: Every evening | ORAL | 0 refills | Status: AC | PRN
  Filled 2024-04-11 (×2): qty 14, 7d supply, fill #0

## 2024-04-23 ENCOUNTER — Other Ambulatory Visit: Payer: Self-pay

## 2024-05-12 ENCOUNTER — Other Ambulatory Visit: Payer: Self-pay

## 2024-05-20 ENCOUNTER — Other Ambulatory Visit (HOSPITAL_COMMUNITY): Payer: Self-pay

## 2024-05-20 MED ORDER — FAMOTIDINE 40 MG PO TABS
40.0000 mg | ORAL_TABLET | Freq: Two times a day (BID) | ORAL | 1 refills | Status: AC
  Filled 2024-05-20: qty 180, 90d supply, fill #0

## 2024-05-27 ENCOUNTER — Other Ambulatory Visit (HOSPITAL_COMMUNITY): Payer: Self-pay

## 2024-05-27 MED ORDER — PANTOPRAZOLE SODIUM 40 MG PO TBEC
40.0000 mg | DELAYED_RELEASE_TABLET | Freq: Every morning | ORAL | 3 refills | Status: AC
  Filled 2024-05-27: qty 90, 90d supply, fill #0

## 2024-05-28 ENCOUNTER — Other Ambulatory Visit: Payer: Self-pay

## 2024-06-11 ENCOUNTER — Other Ambulatory Visit (HOSPITAL_COMMUNITY): Payer: Self-pay

## 2024-06-11 MED ORDER — ESCITALOPRAM OXALATE 20 MG PO TABS: 20.0000 mg | ORAL_TABLET | Freq: Every day | ORAL | 1 refills | Status: AC

## 2024-06-11 MED ORDER — OLMESARTAN MEDOXOMIL 20 MG PO TABS: 20.0000 mg | ORAL_TABLET | Freq: Every day | ORAL | 1 refills | Status: AC

## 2024-06-11 MED ORDER — FAMOTIDINE 40 MG PO TABS: 40.0000 mg | ORAL_TABLET | Freq: Every day | ORAL | 1 refills | Status: AC
